# Patient Record
Sex: Male | Born: 1990 | Race: White | Hispanic: No | Marital: Single | Smoking: Former smoker
Health system: Southern US, Community
[De-identification: ages and names within clinical notes are randomized; demographics above are authoritative.]

## PROBLEM LIST (undated history)

## (undated) DIAGNOSIS — T7840XA Allergy, unspecified, initial encounter: Secondary | ICD-10-CM

## (undated) HISTORY — DX: Allergy, unspecified, initial encounter: T78.40XA

## (undated) HISTORY — PX: TONSILLECTOMY AND ADENOIDECTOMY: SUR1326

## (undated) HISTORY — PX: OTHER SURGICAL HISTORY: SHX169

---
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
## 2010-06-26 LAB — URINALYSIS, ROUTINE W REFLEX MICROSCOPIC
Hgb urine dipstick: NEGATIVE
Nitrite: NEGATIVE
Specific Gravity, Urine: 1.022 (ref 1.005–1.030)
Urobilinogen, UA: 0.2 mg/dL (ref 0.0–1.0)

## 2010-07-27 NOTE — Op Note (Signed)
[place]. [facility]  Patient:    Marvin Tyler, Marvin Tyler                     MRN: [id number] Proc. Date: 04/23/99 Adm. Date:  [id number] Attending:  Serena Colonel H CC:         Linward Headland, M.D.                           Operative Report  PREOPERATIVE DIAGNOSIS: 1. Eustachian tube dysfunction. 2. Chronic mucoid otitis media. 3. Chronic tonsillitis.  POSTOPERATIVE DIAGNOSIS: 1. Eustachian tube dysfunction. 2. Chronic mucoid otitis media. 3. Chronic tonsillitis.  OPERATION PERFORMED: 1. Bilateral myringotomy with tubes. 2. Tonsillectomy and adenoidectomy.  SURGEON:  Jefry H. Pollyann Kennedy, M.D.  ANESTHESIA:  General endotracheal.  COMPLICATIONS:  None.  ESTIMATED BLOOD LOSS:  10 cc.  FINDINGS:  Diffuse enlargement of the tonsils with mild enlargement of the adenoid pad and thin atrophic tympanic membranes without any signs of effusion today.  REFERRING PHYSICIAN:  Linward Headland, M.D.  HISTORY:  The patient is an 20-year-old with a history of recurrent tonsillitis ith chronic ear infections and history of tubes in the past.  The risks, benefits, alternatives and complications of the procedure were explained to the mother and father, who seemed to understand and agreed to surgery.  DESCRIPTION OF PROCEDURE:  The patient was taken to the operating room and placed on the operating table in supine position.  Following induction of general endotracheal anesthesia, the patient was prepped and draped in standard fashion.  1 - Bilateral myringotomy and tubes.  The ears were examined using the operating microscope and cleaned of cerumen.  Anterior inferior myringotomy incisions were created and Sheehy tubes were placed without difficulty bilaterally. Cortisporin was dripped into the ear canals and cotton balls placed at the external meatus bilaterally.  2 - Tonsillectomy and adenoidectomy.  The table was turned 90 degrees and the patient was  positioned for pharyngeal surgery.  A Crowe-Davis mouth gag was inserted into the oral cavity and used to retract the tongue and mandible and attached to the Mayo stand.  Inspection of the palate revealed no evidence of submucous cleft and the soft palate was of adequate length.  A red rubber catheter was inserted into the right side of the nose, withdrawn through the mouth and used to retract the soft palate and uvula.  Adenoid tissue was removed with a single  pass of a large adenoid curet.  The nasopharynx was then packed during the tonsillectomy.  Tonsillectomy was performed using electrocautery dissection keeping the capsule intact.  Small bleeders were coagulated along the dissection.  The packing was removed from the nasopharynx and suction cautery was used to obliterate additional lymphoid tissue and provide hemostasis of the adenoid bed.  After adequate hemostasis was achieved in the oropharynx and nasopharynx, the pharynx was suctioned of blood and secretions, irrigated with saline solution and an orogastric tube was used to aspirate the contents of the stomach.  The patient was then awakened, extubated and transported to recovery in stable condition. DD:  04/23/99 TD:  04/23/99 Job: [id number]

## 2014-11-21 ENCOUNTER — Emergency Department (HOSPITAL_COMMUNITY)
Admission: EM | Admit: 2014-11-21 | Discharge: 2014-11-21 | Disposition: A | Discharge status: Home / Self Care | Payer: 59 | Attending: Emergency Medicine | Admitting: Emergency Medicine

## 2014-11-21 ENCOUNTER — Encounter (HOSPITAL_COMMUNITY): Payer: Self-pay | Admitting: Emergency Medicine

## 2014-11-21 DIAGNOSIS — Z720 Tobacco use: Secondary | ICD-10-CM | POA: Diagnosis not present

## 2014-11-21 DIAGNOSIS — Y998 Other external cause status: Secondary | ICD-10-CM | POA: Diagnosis not present

## 2014-11-21 DIAGNOSIS — Y9289 Other specified places as the place of occurrence of the external cause: Secondary | ICD-10-CM | POA: Insufficient documentation

## 2014-11-21 DIAGNOSIS — Y9389 Activity, other specified: Secondary | ICD-10-CM | POA: Diagnosis not present

## 2014-11-21 DIAGNOSIS — S30860A Insect bite (nonvenomous) of lower back and pelvis, initial encounter: Secondary | ICD-10-CM | POA: Diagnosis not present

## 2014-11-21 DIAGNOSIS — W57XXXA Bitten or stung by nonvenomous insect and other nonvenomous arthropods, initial encounter: Secondary | ICD-10-CM | POA: Diagnosis not present

## 2014-11-21 NOTE — ED Provider Notes (Signed)
CSN: [id number]     Arrival date & time 11/21/14  1021 History   First MD Initiated Contact with Patient 11/21/14 1041     Chief Complaint  Patient presents with  . Insect Bite    HPI   24 year old male presents today with an insect bite. Patient reports yesterday he felt as if he had been bit by something on his lower back. He noticed immediate redness to the area. He reports the last night it was significantly red which larger than today. Patient reports that after waking up this morning he felt "tired". He reports the redness has reduced since the initial bite. Patient denies fever, chills, headache, joint aches.  History reviewed. No pertinent past medical history. Past Surgical History  Procedure Laterality Date  . Tonsillectomy and adenoidectomy    . Tubes in ears     Family History  Problem Relation Age of Onset  . Diabetes Other    Social History  Substance Use Topics  . Smoking status: Current Every Day Smoker    Types: Cigarettes  . Smokeless tobacco: None  . Alcohol Use: Yes     Comment: social    Review of Systems  All other systems reviewed and are negative.     Allergies  Review of patient's allergies indicates no known allergies.  Home Medications   Prior to Admission medications   Not on File   BP 119/62 mmHg  Pulse 93  Temp(Src) 97.9 F (36.6 C) (Oral)  Resp 16  SpO2 97% Physical Exam  Constitutional: He is oriented to person, place, and time. He appears well-developed and well-nourished.  HENT:  Head: Normocephalic and atraumatic.  Eyes: Conjunctivae are normal. Pupils are equal, round, and reactive to light. Right eye exhibits no discharge. Left eye exhibits no discharge. No scleral icterus.  Neck: Normal range of motion. No JVD present. No tracheal deviation present.  Cardiovascular: Regular rhythm, normal heart sounds and intact distal pulses.  Exam reveals no gallop and no friction rub.   No murmur heard. Pulmonary/Chest: Effort normal  and breath sounds normal. No stridor. No respiratory distress. He has no wheezes. He has no rales. He exhibits no tenderness.  Abdominal: Soft. He exhibits no distension and no mass. There is no tenderness. There is no rebound and no guarding.  Neurological: He is alert and oriented to person, place, and time. Coordination normal.  Skin: Skin is warm and dry.  Small area of redness to the lower back, no signs of cellulitis, warmth, discharge  Psychiatric: He has a normal mood and affect. His behavior is normal. Judgment and thought content normal.  Nursing note and vitals reviewed.   ED Course  Procedures (including critical care time) Labs Review Labs Reviewed - No data to display  Imaging Review No results found. I have personally reviewed and evaluated these images and lab results as part of my medical decision-making.   EKG Interpretation None      MDM   Final diagnoses:  Insect bite    Labs:  Imaging:  Consults:  Therapeutics:  Discharge Meds:   Assessment/Plan: Patient presents with likely localized reaction to insect bite, no signs of infection. Patient will be discharged home with instructions to use ice, Benadryl, follow up with primary care if symptoms persist, return immediately if symptoms worsen.         Eyvonne Mechanic, PA-C 11/22/14 2956  Raeford Razor, MD 12/01/14 [phone]

## 2014-11-21 NOTE — ED Notes (Signed)
Pt states he thinks he got bit by a spider on his back yesterday  Pt states he has generalized soreness all over his body  Pt states he does not know what kind of spider it was  Pt has red raised area noted to his lower back

## 2014-11-21 NOTE — Discharge Instructions (Signed)
Please read attached information please follow-up for further evaluation if symptoms persist beyond 3 days. Please return immediately if symptoms worsen.

## 2015-02-23 ENCOUNTER — Ambulatory Visit (INDEPENDENT_AMBULATORY_CARE_PROVIDER_SITE_OTHER): Payer: 59 | Admitting: Family Medicine

## 2015-02-23 VITALS — BP 120/70 | HR 68 | Temp 98.8°F | Resp 20 | Ht 69.0 in | Wt 147.6 lb

## 2015-02-23 DIAGNOSIS — A630 Anogenital (venereal) warts: Secondary | ICD-10-CM | POA: Diagnosis not present

## 2015-02-23 DIAGNOSIS — J302 Other seasonal allergic rhinitis: Secondary | ICD-10-CM | POA: Diagnosis not present

## 2015-02-23 DIAGNOSIS — Z0000 Encounter for general adult medical examination without abnormal findings: Secondary | ICD-10-CM | POA: Diagnosis not present

## 2015-02-23 DIAGNOSIS — Z711 Person with feared health complaint in whom no diagnosis is made: Secondary | ICD-10-CM

## 2015-02-23 DIAGNOSIS — M24211 Disorder of ligament, right shoulder: Secondary | ICD-10-CM

## 2015-02-23 NOTE — Progress Notes (Signed)
Patient ID: Marvin Tyler, male    DOB: 09/23/1990  Age: 24 y.o. MRN: [id number]  Shoulder pains  Subjective:   He had some kind of an injury in his right shoulder when he is very young. He is continued to have a popping in his shoulder when he rotates backwards. He has some mild decreased range of motion. He saw an orthopedist many years ago about it.  Current allergies, medications, problem list, past/family and social histories reviewed.  Objective:  BP 120/70 mmHg  Pulse 68  Temp(Src) 98.8 F (37.1 C) (Oral)  Resp 20  Ht 5\' 9"  (1.753 m)  Wt 147 lb 9.6 oz (66.951 kg)  BMI 21.79 kg/m2  SpO2 98%  There is a popping in the right shoulder girdle when he rotates it backwards. Strength seems good. Range of motion mildly decreased.  Assessment & Plan:   Assessment: Laxity of shoulder girdle   Plan: We'll refer to orthopedics for the shoulder laxity  Offered to treat his genital warts but he declined for now. He will return if he were to desires it done.  Orders Placed This Encounter  Procedures  . GC/Chlamydia Probe Amp  . RPR  . HIV antibody         Patient Instructions  Return if you desire me to freeze the warts on your penis. You can try over-the-counter methods if you desire.  Use protection when sexually involved  Return as needed  Referral to orthopedics    Return if symptoms worsen or fail to improve.   HOPPER,DAVID, MD 02/23/2015
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
## 2015-02-23 NOTE — Patient Instructions (Addendum)
Return if you desire me to freeze the warts on your penis. You can try over-the-counter methods if you desire.  Use protection when sexually involved  Return as needed  Referral to orthopedics

## 2015-02-24 LAB — SYPHILIS: RPR W/REFLEX TO RPR TITER AND TREPONEMAL ANTIBODIES, TRADITIONAL SCREENING AND DIAGNOSIS ALGORITHM

## 2015-02-25 LAB — GC/CHLAMYDIA PROBE AMP
CT Probe RNA: NOT DETECTED
GC PROBE AMP APTIMA: NOT DETECTED

## 2015-02-25 LAB — HIV ANTIBODY (ROUTINE TESTING W REFLEX): HIV 1&2 Ab, 4th Generation: NONREACTIVE

## 2015-04-18 ENCOUNTER — Ambulatory Visit: Payer: Worker's Compensation

## 2015-04-18 ENCOUNTER — Ambulatory Visit (INDEPENDENT_AMBULATORY_CARE_PROVIDER_SITE_OTHER): Payer: Worker's Compensation | Admitting: Internal Medicine

## 2015-04-18 VITALS — BP 112/72 | HR 75 | Temp 98.4°F | Resp 18 | Ht 69.0 in | Wt 150.0 lb

## 2015-04-18 DIAGNOSIS — S39012A Strain of muscle, fascia and tendon of lower back, initial encounter: Secondary | ICD-10-CM | POA: Diagnosis not present

## 2015-04-18 DIAGNOSIS — S39011A Strain of muscle, fascia and tendon of abdomen, initial encounter: Secondary | ICD-10-CM

## 2015-04-18 DIAGNOSIS — M546 Pain in thoracic spine: Secondary | ICD-10-CM

## 2015-04-18 DIAGNOSIS — S233XXA Sprain of ligaments of thoracic spine, initial encounter: Secondary | ICD-10-CM | POA: Diagnosis not present

## 2015-04-18 DIAGNOSIS — S29012A Strain of muscle and tendon of back wall of thorax, initial encounter: Secondary | ICD-10-CM

## 2015-04-18 MED ORDER — MELOXICAM 15 MG PO TABS: 15.0000 mg | ORAL_TABLET | Freq: Every day | ORAL | Status: DC

## 2015-04-18 MED ORDER — CYCLOBENZAPRINE HCL 10 MG PO TABS: 10.0000 mg | ORAL_TABLET | Freq: Three times a day (TID) | ORAL | Status: DC | PRN

## 2015-04-18 MED ORDER — HYDROCODONE-ACETAMINOPHEN 5-325 MG PO TABS: 1.0000 | ORAL_TABLET | Freq: Four times a day (QID) | ORAL | Status: DC | PRN

## 2015-04-18 NOTE — Progress Notes (Addendum)
Subjective:  By signing my name below, I, Essence Howell, attest that this documentation has been prepared under the direction and in the presence of Tonye Pearson, MD Electronically Signed: Charline Bills, ED Scribe 04/18/2015 at 2:24 PM.   Patient ID: Marvin Tyler, male    DOB: Dec 17, 1990, 25 y.o.   MRN: [id number]  Chief Complaint  Patient presents with  . Back Injury    he twisted his back today, while trying to lift something heavy   HPI HPI Comments: Marvin Tyler is a 25 y.o. male who presents to the [facility] complaining of worker's comp back injury sustained PTA. Pt states that he was kneeling on 1 knee and pulling a heavy filter out of a manhole when he felt sudden onset of stinging sensation between his shoulder blades, his low back and his lower abdomen-- pain in thoracic area now described as a constant stabbing pain. Pain is worsened with breathing and twisting. Getting worse as day goes on No prior back injuries  Past Medical History  Diagnosis Date  . Allergy    No current outpatient prescriptions on file prior to visit.   No current facility-administered medications on file prior to visit.   No Known Allergies   Review of Systems  Musculoskeletal: Positive for back pain.      Objective:   Physical Exam  Constitutional: He is oriented to person, place, and time. He appears well-developed and well-nourished. No distress.  HENT:  Head: Normocephalic and atraumatic.  Eyes: Conjunctivae and EOM are normal.  Neck: Normal range of motion. Neck supple.  The neck has a full ROM.   Cardiovascular: Normal rate.   Pulmonary/Chest: Effort normal. No respiratory distress.  Abdominal:  Soft. Not distended. LLQ has 2cm area of tenderness and sl swelling but no abd wall muscle defect  Musculoskeletal: Normal range of motion.  The spine is straight without obvious swelling. Very tender to palpation T3-T9 with pain on rotation. The lumbar spine  is mildly tender over the L iliac crest but straight leg raise to 90 degrees bilaterally. Intact. No sensory or motor losses.   Neurological: He is alert and oriented to person, place, and time.  Skin: Skin is warm and dry.  Psychiatric: He has a normal mood and affect. His behavior is normal.  Nursing note and vitals reviewed.    xray=no bony abn of Tspine Assessment & Plan:  Pain in thoracic spine - Plan: DG Thoracic Spine 2 View  Thoracic sprain and strain, initial encounter  Abdominal muscle strain, initial encounter  Lumbar strain, initial encounter  Meds ordered this encounter  Medications  . meloxicam (MOBIC) 15 MG tablet    Sig: Take 1 tablet (15 mg total) by mouth daily.    Dispense:  30 tablet    Refill:  0  . cyclobenzaprine (FLEXERIL) 10 MG tablet    Sig: Take 1 tablet (10 mg total) by mouth 3 (three) times daily as needed for muscle spasms. If makes too sleepy just take at bedtime    Dispense:  40 tablet    Refill:  0  . HYDROcodone-acetaminophen (NORCO) 5-325 MG tablet    Sig: Take 1 tablet by mouth every 6 (six) hours as needed for moderate pain. May use 2 at bedtime if can't sleep. Advise not taking during day ay work.    Dispense:  20 tablet    Refill:  0   fu 1 wk/work restrictions  I have completed the  patient encounter in its entirety as documented by the scribe, with editing by me where necessary. Saw [place] P. Laney Pastor, M.D.

## 2015-04-24 ENCOUNTER — Ambulatory Visit (INDEPENDENT_AMBULATORY_CARE_PROVIDER_SITE_OTHER): Payer: Worker's Compensation | Admitting: Physician Assistant

## 2015-04-24 ENCOUNTER — Encounter: Payer: Self-pay | Admitting: *Deleted

## 2015-04-24 VITALS — BP 108/66 | HR 97 | Temp 98.4°F | Resp 18 | Ht 69.0 in | Wt 148.6 lb

## 2015-04-24 DIAGNOSIS — S39012D Strain of muscle, fascia and tendon of lower back, subsequent encounter: Secondary | ICD-10-CM | POA: Diagnosis not present

## 2015-04-24 DIAGNOSIS — Z791 Long term (current) use of non-steroidal anti-inflammatories (NSAID): Secondary | ICD-10-CM | POA: Diagnosis not present

## 2015-04-24 DIAGNOSIS — Z79899 Other long term (current) drug therapy: Secondary | ICD-10-CM | POA: Insufficient documentation

## 2015-04-24 DIAGNOSIS — S39011D Strain of muscle, fascia and tendon of abdomen, subsequent encounter: Secondary | ICD-10-CM

## 2015-04-24 DIAGNOSIS — Y998 Other external cause status: Secondary | ICD-10-CM | POA: Diagnosis not present

## 2015-04-24 DIAGNOSIS — Y9389 Activity, other specified: Secondary | ICD-10-CM | POA: Insufficient documentation

## 2015-04-24 DIAGNOSIS — S29012D Strain of muscle and tendon of back wall of thorax, subsequent encounter: Secondary | ICD-10-CM | POA: Diagnosis not present

## 2015-04-24 DIAGNOSIS — S233XXD Sprain of ligaments of thoracic spine, subsequent encounter: Secondary | ICD-10-CM

## 2015-04-24 DIAGNOSIS — F17210 Nicotine dependence, cigarettes, uncomplicated: Secondary | ICD-10-CM | POA: Insufficient documentation

## 2015-04-24 DIAGNOSIS — W2209XA Striking against other stationary object, initial encounter: Secondary | ICD-10-CM | POA: Insufficient documentation

## 2015-04-24 DIAGNOSIS — T1592XA Foreign body on external eye, part unspecified, left eye, initial encounter: Secondary | ICD-10-CM | POA: Diagnosis present

## 2015-04-24 DIAGNOSIS — Y9289 Other specified places as the place of occurrence of the external cause: Secondary | ICD-10-CM | POA: Insufficient documentation

## 2015-04-24 NOTE — Patient Instructions (Signed)
Take the meloxicam (anti-inflammatory) every day.  Take the cyclobenzaprine at bedtime, and you can try taking 1/2 tablet to see if that is less disruptive in the morning.

## 2015-04-24 NOTE — Progress Notes (Signed)
Marvin Tyler 1991/01/13 24 y.o.   Chief Complaint  Patient presents with  . Follow-up    W/C     Date of Injury: 04/18/2015  History of Present Illness:  Presents for re-evaluation of work-related complaint.  He sustained strain of the thoracic and lumbar spine and abdominal wall on 04/18/2015. Xrays of the T-spine were negative for bony injury.  Prescribed meloxicam, cyclobenzaprine and hydrocodone, taken out of work, and advised to RTC in 1 week.  Still sore.  "I haven't really done anything besides take care of my little girl and lay around the house." Lifting her is "terrible." She is 4 months old and weighs about 20 lbs. Feels the pain between his shoulders and in the lower back. Lifting and twisting cause burning sensation. Even sitting in the exam room, he describes a stinging sensation. Driving is better.   Didn't like how the hydrocodone and cyclobenzaprine made him feel, so he stopped them. Only took the meloxicam about 3 times "I'm bad with remembering to take medicine."  Not ready to do regular duty. 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     Review of Systems  Constitutional: Negative for fever, chills and malaise/fatigue.  Cardiovascular: Negative for leg swelling.  Gastrointestinal: Negative for abdominal pain.  Genitourinary: Negative for dysuria, urgency, frequency and flank pain.  Musculoskeletal: Positive for myalgias and back pain. Negative for joint pain, falls and neck pain.  Neurological: Negative for dizziness, tingling, sensory change, focal weakness and weakness.  Endo/Heme/Allergies: Does not bruise/bleed easily.      No Known Allergies   Current medications reviewed and updated. Past medical history, family history, social history have been reviewed and updated.   Physical Exam  Constitutional: He is oriented to person, place, and time and well-developed, well-nourished, and in no  distress. No distress.  BP 108/66 mmHg  Pulse 97  Temp(Src) 98.4 F (36.9 C) (Oral)  Resp 18  Ht  (1.753 m)  Wt 148 lb 9.6 oz (67.405 kg)  BMI 21.93 kg/m2  SpO2 97%   HENT:  Head: Normocephalic and atraumatic.  Eyes: Conjunctivae are normal. No scleral icterus.  Neck: Neck supple. No thyromegaly present.  Cardiovascular: Normal rate.   Pulmonary/Chest: Effort normal.  Musculoskeletal:       Cervical back: Normal.       Thoracic back: He exhibits tenderness (mild), bony tenderness (mild) and pain. He exhibits normal range of motion, no swelling, no edema, no deformity, no laceration, no spasm and normal pulse.       Lumbar back: Normal.       Back:  Lymphadenopathy:    He has no cervical adenopathy.  Neurological: He is alert and oriented to person, place, and time. He has normal motor skills, normal sensation, normal strength and normal reflexes. He displays normal speech and normal reflexes. Gait normal.     Assessment and Plan:  1. Thoracic sprain and strain, subsequent encounter 2. Lumbar strain, subsequent encounter Improving. Advised to take the meloxicam daily, and try taking 1/2 tablet of cyclobenzaprine at HS to see if he gets some benefit without feeling groggy in the morning. RTW with modified duty: no lifting, bending/stooping, twisting, pulling/pushing >5 lbs. Re-evaluate in 1 week.   3. Abdominal muscle strain, subsequent encounter Resolved.   Fernande Bras, PA-C Physician Assistant-Certified [facility]
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
## 2015-04-24 NOTE — ED Notes (Signed)
Vision check 20/20 both eyes.

## 2015-04-25 ENCOUNTER — Emergency Department
Admission: EM | Admit: 2015-04-25 | Discharge: 2015-04-25 | Disposition: A | Discharge status: Home / Self Care | Payer: 59 | Attending: Emergency Medicine | Admitting: Emergency Medicine

## 2015-04-25 DIAGNOSIS — T1592XA Foreign body on external eye, part unspecified, left eye, initial encounter: Secondary | ICD-10-CM

## 2015-04-25 MED ORDER — CIPROFLOXACIN HCL 0.3 % OP SOLN
2.0000 [drp] | Freq: Once | OPHTHALMIC | Status: AC
  Administered 2015-04-25: 2 [drp] via OPHTHALMIC
  Filled 2015-04-25: qty 2.5

## 2015-04-25 MED ORDER — FLUORESCEIN SODIUM 1 MG OP STRP
ORAL_STRIP | OPHTHALMIC | Status: AC
  Filled 2015-04-25: qty 1

## 2015-04-25 MED ORDER — FLUORESCEIN SODIUM 1 MG OP STRP: 1.0000 | ORAL_STRIP | Freq: Once | OPHTHALMIC | Status: DC

## 2015-04-25 MED ORDER — TETRACAINE HCL 0.5 % OP SOLN
OPHTHALMIC | Status: AC
  Administered 2015-04-25: 1 [drp] via OPHTHALMIC
  Filled 2015-04-25: qty 2

## 2015-04-25 MED ORDER — TETRACAINE HCL 0.5 % OP SOLN
1.0000 [drp] | Freq: Once | OPHTHALMIC | Status: AC
  Administered 2015-04-25: 1 [drp] via OPHTHALMIC

## 2015-04-25 NOTE — Discharge Instructions (Signed)
Eye Foreign Body  A foreign body refers to any object on the surface of the eye or in the eyeball that should not be there. A foreign body may be a small speck of dirt or dust, a hair or eyelash, a splinter, or any other object.   SIGNS AND SYMPTOMS  Symptoms depend on what the foreign body is and where it is in the eye. The most common locations are:    On the inner surface of the upper or lower eyelids or on the covering of the white part of the eye (conjunctiva). Symptoms in this location are:    Pain and irritation, especially when blinking.    The feeling that something is in the eye.   On the surface of the clear covering on the front of the eye (cornea). Symptoms in this location include:    Pain and irritation.     Small "rust rings" around a metallic foreign body.    The feeling that something is in the eye.    Inside the eyeball. Foreign bodies inside the eye may cause:     Great pain.     Immediate loss of vision.     Distortion of the pupil.  DIAGNOSIS   Foreign bodies are found during an exam by an eye specialist. Those on the eyelids, conjunctiva, or cornea are usually (but not always) easily found. When a foreign body is inside the eyeball, a cloudiness of the lens (cataract) may form almost right away. This makes it hard for an eye specialist to find the foreign body. Tests may be needed, including ultrasound testing, X-rays, and CT scans.  TREATMENT    Foreign bodies on the eyelids, conjunctiva, or cornea are often removed easily and painlessly.   Rust in the cornea may require the use of a drill-like instrument to remove the rust.   If the foreign body has caused a scratch or a rubbing or scraping (abrasion) of the cornea, this may be treated with antibiotic drops or ointment. A pressure patch may be put over your eye.   If the foreign body is inside your eyeball, surgery is needed right away. This is a medical emergency. Foreign bodies inside the eye threaten vision. A person may even  lose his or her eye.  HOME CARE INSTRUCTIONS    Take medicines only as directed by your health care provider. Use eye drops or ointment as directed.   If no eye patch was applied:    Keep your eye closed as much as possible.    Do not rub your eye.    Wear dark glasses as needed to protect your eyes from bright light.    Do not wear contact lenses until your eye feels normal again, or as instructed by your health care provider.    Wear a protective eye covering if there is a risk of eye injury. This is important when working with high-speed tools.   If your eye is patched:    Follow your health care provider's instructions for when to remove the patch.    Do notdrive or operate machinery if your eye is patched. Your ability to judge distances is impaired.   Keep all follow-up visits as directed by your health care provider. This is important.  SEEK MEDICAL CARE IF:    You have increased pain in your eye.   Your vision gets worse.    You have problems with your eye patch.    You have fluid (discharge)   coming from your injured eye.    You have redness and swelling around your affected eye.   MAKE SURE YOU:    Understand these instructions.   Will watch your condition.   Will get help right away if you are not doing well or get worse.     This information is not intended to replace advice given to you by your health care provider. Make sure you discuss any questions you have with your health care provider.     Document Released: 02/25/2005 Document Revised: 03/18/2014 Document Reviewed: 07/23/2012  [employer]vier Interactive Patient Education 2016 [employer]

## 2015-04-25 NOTE — ED Provider Notes (Signed)
[facility] Emergency Department Provider Note  ____________________________________________  Time seen: 12:45 AM  I have reviewed the triage vital signs and the nursing notes.   HISTORY  Chief Complaint Foreign Body in Eye      HPI Marvin Tyler is a 25 y.o. male presents with history of grinding metal yesterday with piece of metal in his left eye. Patient states that he noticed it to the right of his pupil.     Past Medical History  Diagnosis Date  . Allergy     There are no active problems to display for this patient.   Past Surgical History  Procedure Laterality Date  . Tonsillectomy and adenoidectomy    . Tubes in ears      Current Outpatient Rx  Name  Route  Sig  Dispense  Refill  . cyclobenzaprine (FLEXERIL) 10 MG tablet   Oral   Take 1 tablet (10 mg total) by mouth 3 (three) times daily as needed for muscle spasms. If makes too sleepy just take at bedtime   40 tablet   0   . HYDROcodone-acetaminophen (NORCO) 5-325 MG tablet   Oral   Take 1 tablet by mouth every 6 (six) hours as needed for moderate pain. May use 2 at bedtime if can't sleep. Advise not taking during day ay work.   20 tablet   0   . meloxicam (MOBIC) 15 MG tablet   Oral   Take 1 tablet (15 mg total) by mouth daily.   30 tablet   0   . triamcinolone (NASACORT) 55 MCG/ACT AERO nasal inhaler   Nasal   Place 2 sprays into the nose daily.           Allergies No known drug allergies  Family History  Problem Relation Age of Onset  . Diabetes Other   . Headache Brother     Social History Social History  Substance Use Topics  . Smoking status: Current Every Day Smoker -- 0.50 packs/day    Types: Cigarettes  . Smokeless tobacco: Never Used  . Alcohol Use: 0.6 - 1.2 oz/week    1-2 Standard drinks or equivalent per week     Comment: social    Review of Systems  Constitutional: Negative for fever. Eyes: Negative for visual changes. Positive for  left eye foreign body ENT: Negative for sore throat. Cardiovascular: Negative for chest pain. Respiratory: Negative for shortness of breath. Gastrointestinal: Negative for abdominal pain, vomiting and diarrhea. Genitourinary: Negative for dysuria. Musculoskeletal: Negative for back pain. Skin: Negative for rash. Neurological: Negative for headaches, focal weakness or numbness.   10-point ROS otherwise negative.  ____________________________________________   PHYSICAL EXAM:  VITAL SIGNS: ED Triage Vitals  Enc Vitals Group     BP 04/24/15 2156 110/68 mmHg     Pulse Rate 04/24/15 2156 79     Resp 04/24/15 2156 18     Temp 04/24/15 2156 98.4 F (36.9 C)     Temp Source 04/24/15 2156 Oral     SpO2 04/24/15 2156 98 %     Weight 04/24/15 2156 146 lb (66.225 kg)     Height 04/24/15 2156  (1.753 m)     Head Cir --      Peak Flow --      Pain Score 04/24/15 2158 4     Pain Loc --      Pain Edu? --      Excl. in GC? --  Constitutional: Alert and oriented. Well appearing and in no distress. Eyes: Conjunctivae are normal. PERRL. Normal extraocular movements. Foreign body noted at the 7:00 position of the left eye ENT   Head: Normocephalic and atraumatic.   Nose: No congestion/rhinnorhea.   Mouth/Throat: Mucous membranes are moist.   Neck: No stridor. Musculoskeletal: Nontender with normal range of motion in all extremities. No joint effusions.  No lower extremity tenderness nor edema. Neurologic:  Normal speech and language. No gross focal neurologic deficits are appreciated. Speech is normal.  Skin:  Skin is warm, dry and intact. No rash noted.     INITIAL IMPRESSION / ASSESSMENT AND PLAN / ED COURSE  Pertinent labs & imaging results that were available during my care of the patient were reviewed by me and considered in my medical decision making (see chart for details).  Left eye was anesthetized with tetracaine foreign body removed with small gauge  insulin needle. Without difficulty.  ____________________________________________   FINAL CLINICAL IMPRESSION(S) / ED DIAGNOSES  Final diagnoses:  Eye foreign body, left, initial encounter      Darci Current, MD 04/25/15 [phone]

## 2015-05-01 ENCOUNTER — Ambulatory Visit (INDEPENDENT_AMBULATORY_CARE_PROVIDER_SITE_OTHER): Payer: Worker's Compensation | Admitting: Physician Assistant

## 2015-05-01 VITALS — BP 108/62 | HR 71 | Temp 98.7°F | Resp 16

## 2015-05-01 DIAGNOSIS — S39012D Strain of muscle, fascia and tendon of lower back, subsequent encounter: Secondary | ICD-10-CM

## 2015-05-01 DIAGNOSIS — S233XXD Sprain of ligaments of thoracic spine, subsequent encounter: Secondary | ICD-10-CM

## 2015-05-01 DIAGNOSIS — S29012D Strain of muscle and tendon of back wall of thorax, subsequent encounter: Secondary | ICD-10-CM | POA: Diagnosis not present

## 2015-05-01 NOTE — Progress Notes (Signed)
Marvin Tyler 1990-04-05 25 y.o.   Chief Complaint  Patient presents with  . Follow-up    WC thoracic and lumbar strain  . Foreign Body in Eye    possible metal     Date of Injury: 04/18/2015  History of Present Illness:  Presents for re-evaluation of work-related complaint.  He sustained strain of the thoracic and lumbar spine and abdominal wall on 04/18/2015. Xrays of the T-spine were negative for bony injury.  Prescribed meloxicam, cyclobenzaprine and hydrocodone, taken out of work, and advised to RTC in 1 week.  On 04/24/15, he reported he was still sore. Wasn't able to lift even 20 lbs. The pain was between the shoulders and in the lower back. Lifting and twisting caused burning sensation. Even sitting in the exam room, he described a stinging sensation.  Didn't like how the hydrocodone and cyclobenzaprine made him feel, so he stopped them. Only took the meloxicam about 3 times "I'm bad with remembering to take medicine." The abdominal muscle pain had resolved completely. He was encouraged to take the meloxicam daily, and to try taking just 1/2 tablet of cyclobenzaprine at HS to see if that helped and didn't cause the adverse effects.  Today he's feeling a lot better. He hadn't really thought the meloxicam was helping much, but on days when he missed the dose, he could definitely tell.  The lower back pain has resolved. He still feels a stabbing pain along the LEFT shoulder blade. No radicular symptoms. No weakness.  On 04/25/2015 he presented to [facility] ED with FB in the LEFT eye. A small piece of metal was removed without incident. He is tolerating the drops without difficulty, and vision is not impaired. No pain or drainage from the eye.    Review of Systems  Constitutional: Negative for fever, chills and malaise/fatigue.  Eyes: Negative for blurred vision, pain, discharge and redness.  Cardiovascular: Negative for leg swelling.  Gastrointestinal: Negative for abdominal pain.    Genitourinary: Negative for dysuria, urgency, frequency and flank pain.  Musculoskeletal: Positive for myalgias and back pain. Negative for joint pain, falls and neck pain.  Neurological: Negative for dizziness, tingling, sensory change, focal weakness and weakness.  Endo/Heme/Allergies: Does not bruise/bleed easily.      No Known Allergies   Current medications reviewed and updated. Past medical history, family history, social history have been reviewed and updated.   Physical Exam  Constitutional: He is oriented to person, place, and time and well-developed, well-nourished, and in no distress. No distress.  BP 108/66 mmHg  Pulse 97  Temp(Src) 98.4 F (36.9 C) (Oral)  Resp 18  Ht  (1.753 m)  Wt 148 lb 9.6 oz (67.405 kg)  BMI 21.93 kg/m2  SpO2 97%   HENT:  Head: Normocephalic and atraumatic.  Eyes: Conjunctivae, EOM and lids are normal. Pupils are equal, round, and reactive to light. Right eye exhibits no discharge. Left eye exhibits no discharge. No scleral icterus.  No rust ring or FB appreciated on exam with oblique lighting.  Neck: Full passive range of motion without pain and phonation normal. Neck supple. No thyromegaly present.  Cardiovascular: Normal rate.   Pulmonary/Chest: Effort normal.  Musculoskeletal:       Cervical back: Normal.       Thoracic back: He exhibits tenderness (mild) and pain. He exhibits normal range of motion, no bony tenderness (mild), no swelling, no edema, no deformity, no laceration, no spasm and normal pulse.       Lumbar  back: Normal.       Back:  Lymphadenopathy:    He has no cervical adenopathy.  Neurological: He is alert and oriented to person, place, and time. He has normal motor skills, normal sensation, normal strength and normal reflexes. He displays normal speech. Gait normal.  Skin: Skin is warm, dry and intact. No rash noted.     Assessment and Plan:  1. Thoracic sprain and strain, subsequent encounter Improving.  Encouraged continued use of meloxicam. Continue modified duty, but allow lifting up to 20 lbs. Anticipate return to full duty in 1 week.  2. Lumbar strain, subsequent encounter Resolved.    Fernande Bras, PA-C Physician Assistant-Certified [facility]

## 2015-05-01 NOTE — Patient Instructions (Signed)
Please take the meloxicam (anti-inflammatory EVERY DAY). Use the muscle relaxer (cyclobenzaprine), if you need it. Try taking 1/2 tablet at bedtime.

## 2015-05-08 ENCOUNTER — Ambulatory Visit (INDEPENDENT_AMBULATORY_CARE_PROVIDER_SITE_OTHER): Payer: Worker's Compensation | Admitting: Family Medicine

## 2015-05-08 VITALS — BP 120/68 | HR 77 | Temp 98.6°F | Resp 16 | Ht 69.0 in | Wt 147.0 lb

## 2015-05-08 DIAGNOSIS — S29012D Strain of muscle and tendon of back wall of thorax, subsequent encounter: Secondary | ICD-10-CM

## 2015-05-08 DIAGNOSIS — S233XXD Sprain of ligaments of thoracic spine, subsequent encounter: Secondary | ICD-10-CM

## 2015-05-08 NOTE — Patient Instructions (Signed)
Back Pain, Adult °Back pain is very common in adults. The cause of back pain is rarely dangerous and the pain often gets better over time. The cause of your back pain may not be known. Some common causes of back pain include: °· Strain of the muscles or ligaments supporting the spine. °· Wear and tear (degeneration) of the spinal disks. °· Arthritis. °· Direct injury to the back. °For many people, back pain may return. Since back pain is rarely dangerous, most people can learn to manage this condition on their own. °HOME CARE INSTRUCTIONS °Watch your back pain for any changes. The following actions may help to lessen any discomfort you are feeling: °· Remain active. It is stressful on your back to sit or stand in one place for long periods of time. Do not sit, drive, or stand in one place for more than 30 minutes at a time. Take short walks on even surfaces as soon as you are able. Try to increase the length of time you walk each day. °· Exercise regularly as directed by your health care provider. Exercise helps your back heal faster. It also helps avoid future injury by keeping your muscles strong and flexible. °· Do not stay in bed. Resting more than 1-2 days can delay your recovery. °· Pay attention to your body when you bend and lift. The most comfortable positions are those that put less stress on your recovering back. Always use proper lifting techniques, including: °· Bending your knees. °· Keeping the load close to your body. °· Avoiding twisting. °· Find a comfortable position to sleep. Use a firm mattress and lie on your side with your knees slightly bent. If you lie on your back, put a pillow under your knees. °· Avoid feeling anxious or stressed. Stress increases muscle tension and can worsen back pain. It is important to recognize when you are anxious or stressed and learn ways to manage it, such as with exercise. °· Take medicines only as directed by your health care provider. Over-the-counter  medicines to reduce pain and inflammation are often the most helpful. Your health care provider may prescribe muscle relaxant drugs. These medicines help dull your pain so you can more quickly return to your normal activities and healthy exercise. °· Apply ice to the injured area: °· Put ice in a plastic bag. °· Place a towel between your skin and the bag. °· Leave the ice on for 20 minutes, 2-3 times a day for the first 2-3 days. After that, ice and heat may be alternated to reduce pain and spasms. °· Maintain a healthy weight. Excess weight puts extra stress on your back and makes it difficult to maintain good posture. °SEEK MEDICAL CARE IF: °· You have pain that is not relieved with rest or medicine. °· You have increasing pain going down into the legs or buttocks. °· You have pain that does not improve in one week. °· You have night pain. °· You lose weight. °· You have a fever or chills. °SEEK IMMEDIATE MEDICAL CARE IF:  °· You develop new bowel or bladder control problems. °· You have unusual weakness or numbness in your arms or legs. °· You develop nausea or vomiting. °· You develop abdominal pain. °· You feel faint. °  °This information is not intended to replace advice given to you by your health care provider. Make sure you discuss any questions you have with your health care provider. °  °Document Released: 02/25/2005 Document Revised: 03/18/2014 Document Reviewed: 06/29/2013 °[employer] Interactive Patient Education ©2016 [employer] Scapular Winging With Rehab Scapular winging syndrome is also known as serratus anterior palsy or long thoracic nerve injury. The condition is an uncommon injury to the nervous system. The condition is caused by injury to the long thoracic nerve that runs through the neck and shoulder. Injury to the shoulder, such as a fall or repetitive stress on the shoulder causes the nerve to become stretched. Occasionally the injury is the result of an infection of the nerve. Damage  to the long thoracic nerve results in weakness of the serratus anterior muscle. The serratus anterior muscle is responsible for controlling the shoulder blade (scapula). Weakness in this muscle results in a instability (winging) of the scapula. SYMPTOMS   Pain and weakness in the shoulder (usually the back of the shoulder) that is often diffuse or unable to localize.  Loss of or decrease in shoulder function.  Upper back pain while sitting, due to the scapula pressing on the back of the chair.  Visible deformity in the back of the shoulder. CAUSES  Scapular winging is caused by stretching of the long thoracic nerve. Common mechanisms of injury include:  Viral illness.  Repetitive and/or stressful use of the shoulder.  Falling onto the shoulder with the head and neck stretched away from the shoulder. RISK INCREASES WITH:  Contact sports (football, rugby, lacrosse, or soccer).  Activities involving overhead arm movement (baseball, volleyball, or racquet sports).  Poor strength and flexibility. PREVENTION  Warm up and stretch properly before activity.  Allow for adequate recovery between workouts.  Maintain physical fitness:  Strength, flexibility, and endurance.  Cardiovascular fitness.  Learn and use proper technique. When possible, have a coach correct improper technique. PROGNOSIS  Scapular winging normally resolves spontaneously within 18 months. In rare circumstances surgery is recommended.  RELATED COMPLICATIONS   Permanent nerve damage, including pain, numbness, tingle, or weakness.  Shoulder weakness.  Recurrent shoulder pain.  Inability to compete in athletics. TREATMENT Treatment initially involves resting from any activities that aggravate your symptoms. The use of ice and medication may help reduce pain and inflammation. The use of strengthening and stretching exercises may help reduce pain with activity, specifically shoulder exercises that improve range  of motion. These exercises may be performed at home or with referral to a therapist. If symptoms persist for greater than 6 months despite non-surgical (conservative) treatment, then surgery may be recommended. Surgery is only used for the most serious cases and the purpose is to regain function, not to allow an athlete to return to sports. MEDICATION   If pain medication is necessary, then nonsteroidal anti-inflammatory medications, such as aspirin and ibuprofen, or other minor pain relievers, such as acetaminophen, are often recommended.  Do not take pain medication for 7 days before surgery.  Prescription pain relievers may be given if deemed necessary by your caregiver. Use only as directed and only as much as you need. HEAT AND COLD  Cold treatment (icing) relieves pain and reduces inflammation. Cold treatment should be applied for 10 to 15 minutes every 2 to 3 hours for inflammation and pain and immediately after any activity that aggravates your symptoms. Use ice packs or massage the area with a piece of ice (ice massage).  Heat treatment may be used prior to performing the stretching and strengthening activities prescribed by your caregiver, physical therapist, or athletic trainer. Use a heat pack or soak the injury in warm water. SEEK MEDICAL CARE IF:  Treatment seems to offer no benefit, or the condition worsens.  Any medications produce adverse side effects. EXERCISES  RANGE OF MOTION (ROM) AND STRETCHING EXERCISES - Scapular Winging (Serratus Anterior Palsy, Long Thoracic Nerve Injury)  These exercises may help you when beginning to rehabilitate your injury. Your symptoms may resolve with or without further involvement from your physician, physical therapist or athletic trainer. While completing these exercises, remember:   Restoring tissue flexibility helps normal motion to return to the joints. This allows healthier, less painful movement and activity.  An effective stretch  should be held for at least 30 seconds.  A stretch should never be painful. You should only feel a gentle lengthening or release in the stretched tissue. ROM - Pendulum  Bend at the waist so that your right / left arm falls away from your body. Support yourself with your opposite hand on a solid surface, such as a table or a countertop.  Your right / left arm should be perpendicular to the ground. If it is not perpendicular, you need to lean over farther. Relax the muscles in your right / left arm and shoulder as much as possible.  Gently sway your hips and trunk so they move your right / left arm without any use of your right / left shoulder muscles.  Progress your movements so that your right / left arm moves side to side, then forward and backward, and finally, both clockwise and counterclockwise.  Complete __________ repetitions in each direction. Many people use this exercise to relieve discomfort in their shoulder as well as to gain range of motion. Repeat __________ times. Complete this exercise __________ times per day. STRETCH - Flexion, Seated   Sit in a firm chair so that your right / left forearm can rest on a table or on a table or countertop. Your right / left elbow should rest below the height of your shoulder so that your shoulder feels supported and not tense or uncomfortable.  Keeping your right / left shoulder relaxed, lean forward at your waist, allowing your right / left hand to slide forward. Bend forward until you feel a moderate stretch in your shoulder, but before you feel an increase in your pain.  Hold __________ seconds. Slowly return to your starting position. Repeat __________ times. Complete this exercise __________ times per day.  STRETCH - Flexion, Standing  Stand with good posture. With an underhand grip on your right / left and an overhand grip on the opposite hand, grasp a broomstick or cane so that your hands are a little more than shoulder-width  apart.  Keeping your right / left elbow straight and shoulder muscles relaxed, push the stick with your opposite hand to raise your right / left arm in front of your body and then overhead. Raise your arm until you feel a stretch in your right / left shoulder, but before you have increased shoulder pain.  Avoid shrugging your right / left shoulder as your arm rises by keeping your shoulder blade tucked down and toward your mid-back spine. Hold __________ seconds.  Slowly return to the starting position. Repeat __________ times. Complete this exercise __________ times per day. STRETCH - Abduction, Supine  Stand with good posture. With an underhand grip on your right / left and an overhand grip on the opposite hand, grasp a broomstick or cane so that your hands are a little more than shoulder-width apart.  Keeping your right / left elbow straight and shoulder muscles relaxed, push the stick with your opposite hand to raise your right / left arm  out to the side of your body and then overhead. Raise your arm until you feel a stretch in your right / left shoulder, but before you have increased shoulder pain.  Avoid shrugging your right / left shoulder as your arm rises by keeping your shoulder blade tucked down and toward your mid-back spine. Hold __________ seconds.  Slowly return to the starting position. Repeat __________ times. Complete this exercise __________ times per day. ROM - Flexion, Active-Assisted  Lie on your back. You may bend your knees for comfort.  Grasp a broomstick or cane so your hands are about shoulder-width apart. Your right / left hand should grip the end of the stick/cane so that your hand is positioned "thumbs-up," as if you were about to shake hands.  Using your healthy arm to lead, raise your right / left arm overhead until you feel a gentle stretch in your shoulder. Hold __________ seconds.  Use the stick/cane to assist in returning your right / left arm to its  starting position. Repeat __________ times. Complete this exercise __________ times per day.  STRENGTHENING EXERCISES - Scapular Winging (Serratus Anterior Palsy, Long Thoracic Nerve Injury) These exercises may help you when beginning to rehabilitate your injury. They may resolve your symptoms with or without further involvement from your physician, physical therapist or athletic trainer. While completing these exercises, remember:   Muscles can gain both the endurance and the strength needed for everyday activities through controlled exercises.  Complete these exercises as instructed by your physician, physical therapist or athletic trainer. Progress with the resistance and repetition exercises only as your caregiver advises.  You may experience muscle soreness or fatigue, but the pain or discomfort you are trying to eliminate should never worsen during these exercises. If this pain does worsen, stop and make certain you are following the directions exactly. If the pain is still present after adjustments, discontinue the exercise until you can discuss the trouble with your clinician.  During your recovery, avoid activity or exercises which involve actions that place your injured hand or elbow above your head or behind your back or head. These positions stress the tissues which are trying to heal. STRENGTH - Scapular Depression and Adduction   With good posture, sit on a firm chair. Supported your arms in front of you with pillows, arm rests or a table top. Have your elbows in line with the sides of your body.  Gently draw your shoulder blades down and toward your mid-back spine. Gradually increase the tension without tensing the muscles along the top of your shoulders and the back of your neck.  Hold for __________ seconds. Slowly release the tension and relax your muscles completely before completing the next repetition.  After you have practiced this exercise, remove the arm support and  complete it in standing as well as sitting. Repeat __________ times. Complete this exercise __________ times per day.  STRENGTH - Scapular Protractors, Standing   Stand arms-length away from a wall. Place your hands on the wall, keeping your elbows straight.  Begin by dropping your shoulder blades down and toward your mid-back spine.  To strengthen your protractors, keep your shoulder blades down, but slide them forward on your rib cage. It will feel as if you are lifting the back of your rib cage away from the wall. This is a subtle motion and can be challenging to complete. Ask your clinician for further instruction if you are not sure you are doing the exercise correctly.  Hold for  __________ seconds. Slowly return to the starting position, resting the muscles completely before completing the next repetition. Repeat __________ times. Complete this exercise __________ times per day. STRENGTH - Scapular Protractors, Supine  Lie on your back on a firm surface. Extend your right / left arm straight into the air while holding a __________ weight in your hand.  Keeping your head and back in place, lift your shoulder off the floor.  Hold __________ seconds. Slowly return to the starting position and allow your muscles to relax completely before completing the next repetition. Repeat __________ times. Complete this exercise __________ times per day. STRENGTH - Scapular Protractors, Quadruped  Get onto your hands and knees with your shoulders directly over your hands (or as close as you comfortably can be).  Keeping your elbows locked, lift the back of your rib cage up into your shoulder blades so your mid-back rounds-out. Keep your neck muscles relaxed.  Hold this position for __________ seconds. Slowly return to the starting position and allow your muscles to relax completely before completing the next repetition. Repeat __________ times. Complete this exercise __________ times per day.   STRENGTH - Scapular Depressors  Keeping your feet on the floor, lift your bottom from the seat and lock your elbows.  Keeping your elbows straight, allow gravity to pull your body weight down. Your shoulders will rise toward your ears.  Raise your body against gravity by drawing your shoulder blades down your back, shortening the distance between your shoulders and ears. Although your feet should always maintain contact with the floor, your feet should progressively support less body weight as you get stronger.  Hold __________ seconds. In a controlled and slow manner, lower your body weight to begin the next repetition. Repeat __________ times. Complete this exercise __________ times per day.  STRENGTH - Shoulder Extensors, Prone  Lie on your stomach on a firm surface so that your right / left arm overhangs the edge. Rest your forehead on your opposite forearm. With your thumb facing away from your body and your elbow straight, hold a __________ weight in your hand.  Squeeze your right / left shoulder blade to your mid-back spine and then slowly raise your arm behind you to the height of the bed.  Hold for __________ seconds. Slowly reverse the directions and return to the starting position, controlling the weight as you lower your arm. Repeat __________ times. Complete this exercise __________ times per day.  STRENGTH - Horizontal Abductors Choose one of the two oppositions to complete this exercise. Prone: lying on stomach:  Lie on your stomach on a firm surface so that your right / left arm overhangs the edge. Rest your forehead on your opposite forearm. With your palm facing the floor and your elbow straight, hold a __________ weight in your hand.  Squeeze your right / left shoulder blade to your mid-back spine and then slowly raise your arm to the height of the bed.  Hold for __________ seconds. Slowly reverse the directions and return to the starting position, controlling the  weight as you lower your arm. Repeat __________ times. Complete this exercise __________ times per day. Standing:  Secure a rubber exercise band/tubing so that it is at the height of your shoulders when you are either standing or sitting on a firm arm-less chair.  Grasp an end of the band/tubing in each hand and have your palms face each other. Straighten your elbows and lift your hands straight in front of you at shoulder height.  Step back away from the secured end of band/tubing until it becomes tense.  Squeeze your shoulder blades together. Keeping your elbows locked and your hands at shoulder-height, bring your hands out to your side.  Hold __________ seconds. Slowly ease the tension on the band/tubing as you reverse the directions and return to the starting position. Repeat __________ times. Complete this exercise __________ times per day. STRENGTH - Scapular Retractors  Secure a rubber exercise band/tubing so that it is at the height of your shoulders when you are either standing or sitting on a firm arm-less chair.  With a palm-down grip, grasp an end of the band/tubing in each hand. Straighten your elbows and lift your hands straight in front of you at shoulder height. Step back away from the secured end of band/tubing until it becomes tense.  Squeezing your shoulder blades together, draw your elbows back as you bend them. Keep your upper arm lifted away from your body throughout the exercise.  Hold __________ seconds. Slowly ease the tension on the band/tubing as you reverse the directions and return to the starting position. Repeat __________ times. Complete this exercise __________ times per day. STRENGTH - Shoulder Extensors   Secure a rubber exercise band/tubing so that it is at the height of your shoulders when you are either standing or sitting on a firm arm-less chair.  With a thumbs-up grip, grasp an end of the band/tubing in each hand. Straighten your elbows and lift your  hands straight in front of you at shoulder height. Step back away from the secured end of band/tubing until it becomes tense.  Squeezing your shoulder blades together, pull your hands down to the sides of your thighs. Do not allow your hands to go behind you.  Hold for __________ seconds. Slowly ease the tension on the band/tubing as you reverse the directions and return to the starting position. Repeat __________ times. Complete this exercise __________ times per day.  STRENGTH - Scapular Retractors and External Rotators  Secure a rubber exercise band/tubing so that it is at the height of your shoulders when you are either standing or sitting on a firm arm-less chair.  With a palm-down grip, grasp an end of the band/tubing in each hand. Bend your elbows 90 degrees and lift your elbows to shoulder height at your sides. Step back away from the secured end of band/tubing until it becomes tense.  Squeezing your shoulder blades together, rotate your shoulder so that your upper arm and elbow remain stationary, but your fists travel upward to head-height.  Hold __________ for seconds. Slowly ease the tension on the band/tubing as you reverse the directions and return to the starting position. Repeat __________ times. Complete this exercise __________ times per day.  STRENGTH - Scapular Retractors and External Rotators, Rowing  Secure a rubber exercise band/tubing so that it is at the height of your shoulders when you are either standing or sitting on a firm arm-less chair.  With a palm-down grip, grasp an end of the band/tubing in each hand. Straighten your elbows and lift your hands straight in front of you at shoulder height. Step back away from the secured end of band/tubing until it becomes tense.  Step 1: Squeeze your shoulder blades together. Bending your elbows, draw your hands to your chest as if you are rowing a boat. At the end of this motion, your hands and elbow should be at  shoulder-height and your elbows should be out to your sides.  Step 2: Rotate  your shoulder to raise your hands above your head. Your forearms should be vertical and your upper-arms should be horizontal.  Hold for __________ seconds. Slowly ease the tension on the band/tubing as you reverse the directions and return to the starting position. Repeat __________ times. Complete this exercise __________ times per day.  STRENGTH - Scapular Retractors and Elevators  Secure a rubber exercise band/tubing so that it is at the height of your shoulders when you are either standing or sitting on a firm arm-less chair.  With a thumbs-up grip, grasp an end of the band/tubing in each hand. Step back away from the secured end of band/tubing until it becomes tense.  Squeezing your shoulder blades together, straighten your elbows and lift your hands straight over your head.  Hold for __________ seconds. Slowly ease the tension on the band/tubing as you reverse the directions and return to the starting position. Repeat __________ times. Complete this exercise __________ times per day.    This information is not intended to replace advice given to you by your health care provider. Make sure you discuss any questions you have with your health care provider.   Document Released: 02/25/2005 Document Revised: 07/12/2014 Document Reviewed: 06/09/2008 [employer] Interactive Patient Education [employer]
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
## 2016-01-12 ENCOUNTER — Ambulatory Visit: Payer: Worker's Compensation

## 2016-05-01 ENCOUNTER — Emergency Department (HOSPITAL_COMMUNITY)
Admission: EM | Admit: 2016-05-01 | Discharge: 2016-05-01 | Disposition: A | Discharge status: Home / Self Care | Payer: 59 | Attending: Emergency Medicine | Admitting: Emergency Medicine

## 2016-05-01 DIAGNOSIS — T2010XA Burn of first degree of head, face, and neck, unspecified site, initial encounter: Secondary | ICD-10-CM

## 2016-05-01 DIAGNOSIS — Y939 Activity, unspecified: Secondary | ICD-10-CM | POA: Insufficient documentation

## 2016-05-01 DIAGNOSIS — T300 Burn of unspecified body region, unspecified degree: Secondary | ICD-10-CM

## 2016-05-01 DIAGNOSIS — F17210 Nicotine dependence, cigarettes, uncomplicated: Secondary | ICD-10-CM | POA: Insufficient documentation

## 2016-05-01 DIAGNOSIS — Y990 Civilian activity done for income or pay: Secondary | ICD-10-CM | POA: Insufficient documentation

## 2016-05-01 DIAGNOSIS — Y929 Unspecified place or not applicable: Secondary | ICD-10-CM | POA: Insufficient documentation

## 2016-05-01 DIAGNOSIS — S0501XA Injury of conjunctiva and corneal abrasion without foreign body, right eye, initial encounter: Secondary | ICD-10-CM

## 2016-05-01 DIAGNOSIS — X16XXXA Contact with hot heating appliances, radiators and pipes, initial encounter: Secondary | ICD-10-CM | POA: Insufficient documentation

## 2016-05-01 DIAGNOSIS — Z79899 Other long term (current) drug therapy: Secondary | ICD-10-CM | POA: Insufficient documentation

## 2016-05-01 MED ORDER — ERYTHROMYCIN 5 MG/GM OP OINT: TOPICAL_OINTMENT | OPHTHALMIC | 0 refills | Status: DC

## 2016-05-01 MED ORDER — IBUPROFEN 800 MG PO TABS
800.0000 mg | ORAL_TABLET | Freq: Once | ORAL | Status: AC
  Administered 2016-05-01: 800 mg via ORAL
  Filled 2016-05-01: qty 1

## 2016-05-01 MED ORDER — TETRACAINE HCL 0.5 % OP SOLN
1.0000 [drp] | Freq: Once | OPHTHALMIC | Status: AC
  Administered 2016-05-01: 1 [drp] via OPHTHALMIC
  Filled 2016-05-01: qty 2

## 2016-05-01 MED ORDER — SILVER SULFADIAZINE 1 % EX CREA: 1.0000 "application " | TOPICAL_CREAM | Freq: Every day | CUTANEOUS | 0 refills | Status: DC

## 2016-05-01 MED ORDER — OXYCODONE HCL 5 MG PO TABS
5.0000 mg | ORAL_TABLET | Freq: Once | ORAL | Status: AC
  Administered 2016-05-01: 5 mg via ORAL
  Filled 2016-05-01: qty 1

## 2016-05-01 MED ORDER — FLUORESCEIN SODIUM 0.6 MG OP STRP
1.0000 | ORAL_STRIP | Freq: Once | OPHTHALMIC | Status: AC
  Administered 2016-05-01: 1 via OPHTHALMIC
  Filled 2016-05-01: qty 1

## 2016-05-01 MED ORDER — SILVER SULFADIAZINE 1 % EX CREA
TOPICAL_CREAM | Freq: Once | CUTANEOUS | Status: AC
  Administered 2016-05-01: 20:00:00 via TOPICAL
  Filled 2016-05-01: qty 85

## 2016-05-01 MED ORDER — ACETAMINOPHEN 500 MG PO TABS
1000.0000 mg | ORAL_TABLET | Freq: Once | ORAL | Status: AC
  Administered 2016-05-01: 1000 mg via ORAL
  Filled 2016-05-01: qty 2
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
## 2016-05-01 NOTE — ED Notes (Signed)
Pt stable, ambulatory, states understanding of discharge instructions 

## 2016-05-01 NOTE — ED Notes (Signed)
Charge RN aware of needing a room for eval
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
## 2016-09-19 IMAGING — CR DG THORACIC SPINE 2V
3 series · 3 of 3 positions shown · non-contrast
Comparison: None

CLINICAL DATA: Thoracic spine back pain, lifting injury

EXAM:
THORACIC SPINE 2 VIEWS

[AP]
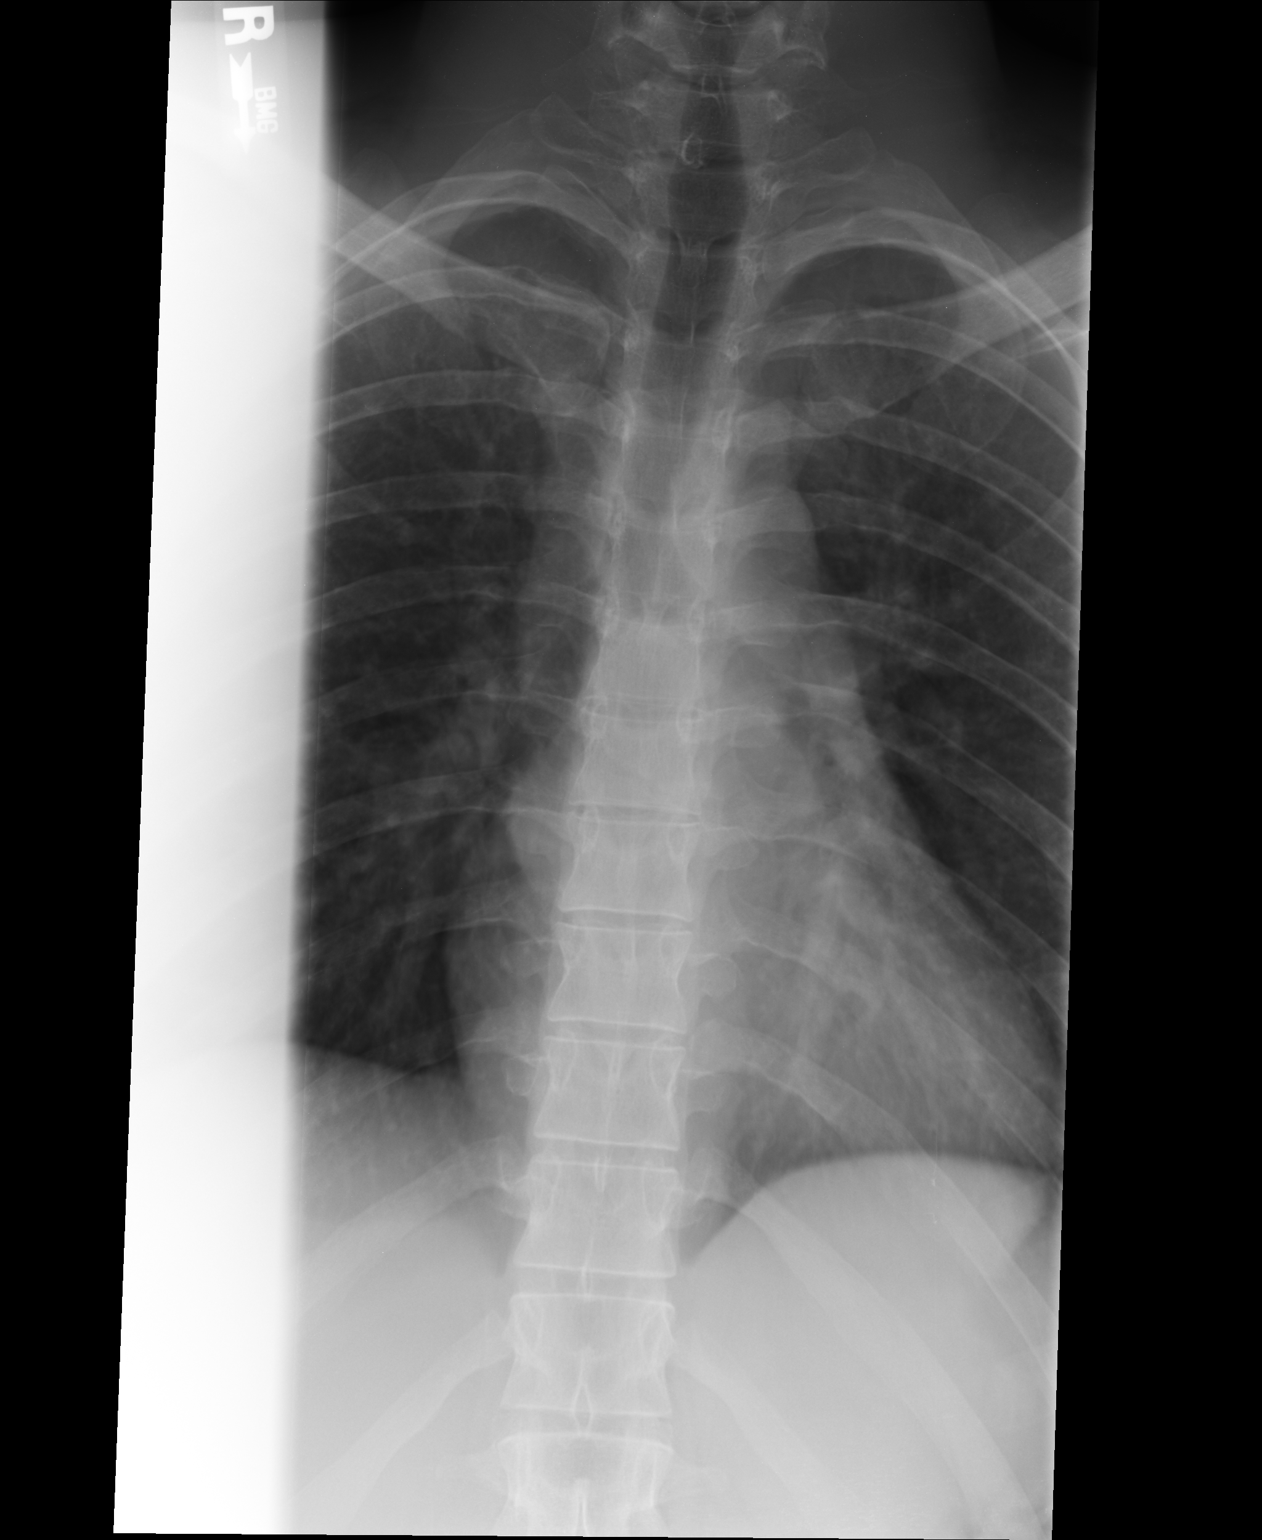

[lateral]
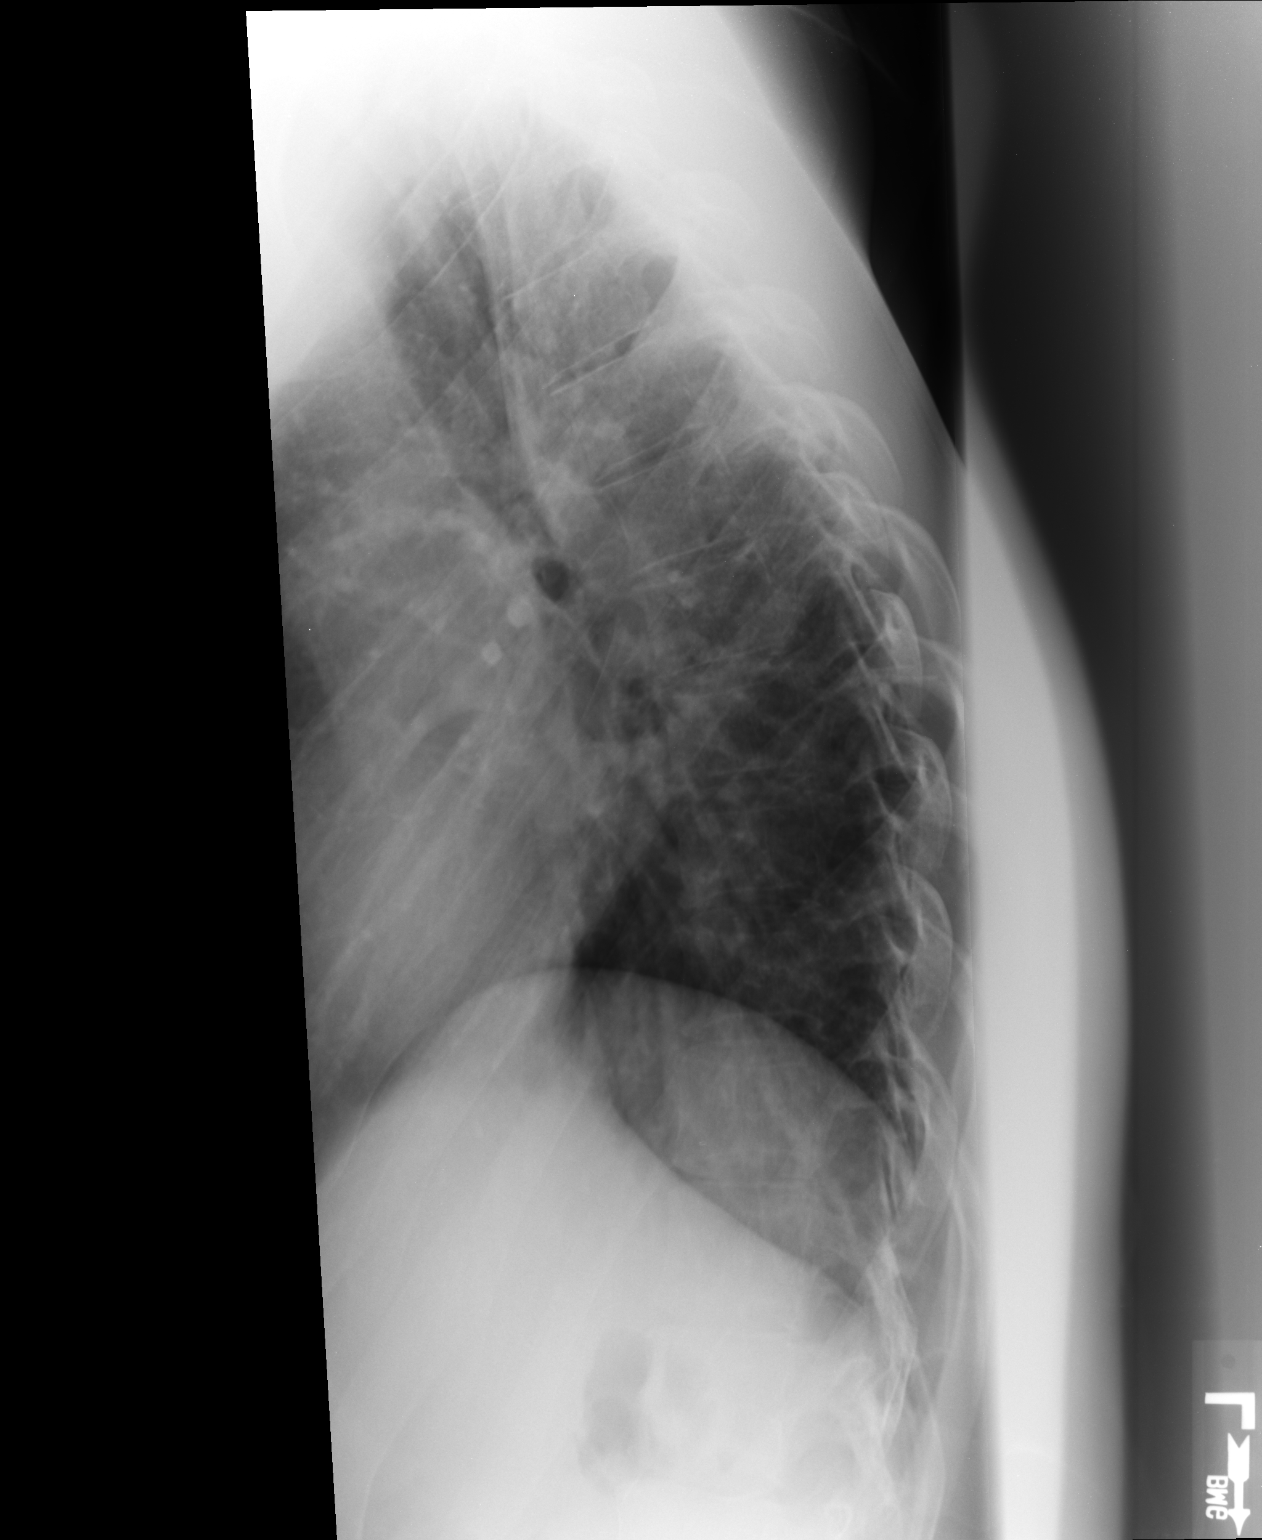

[swimmers]
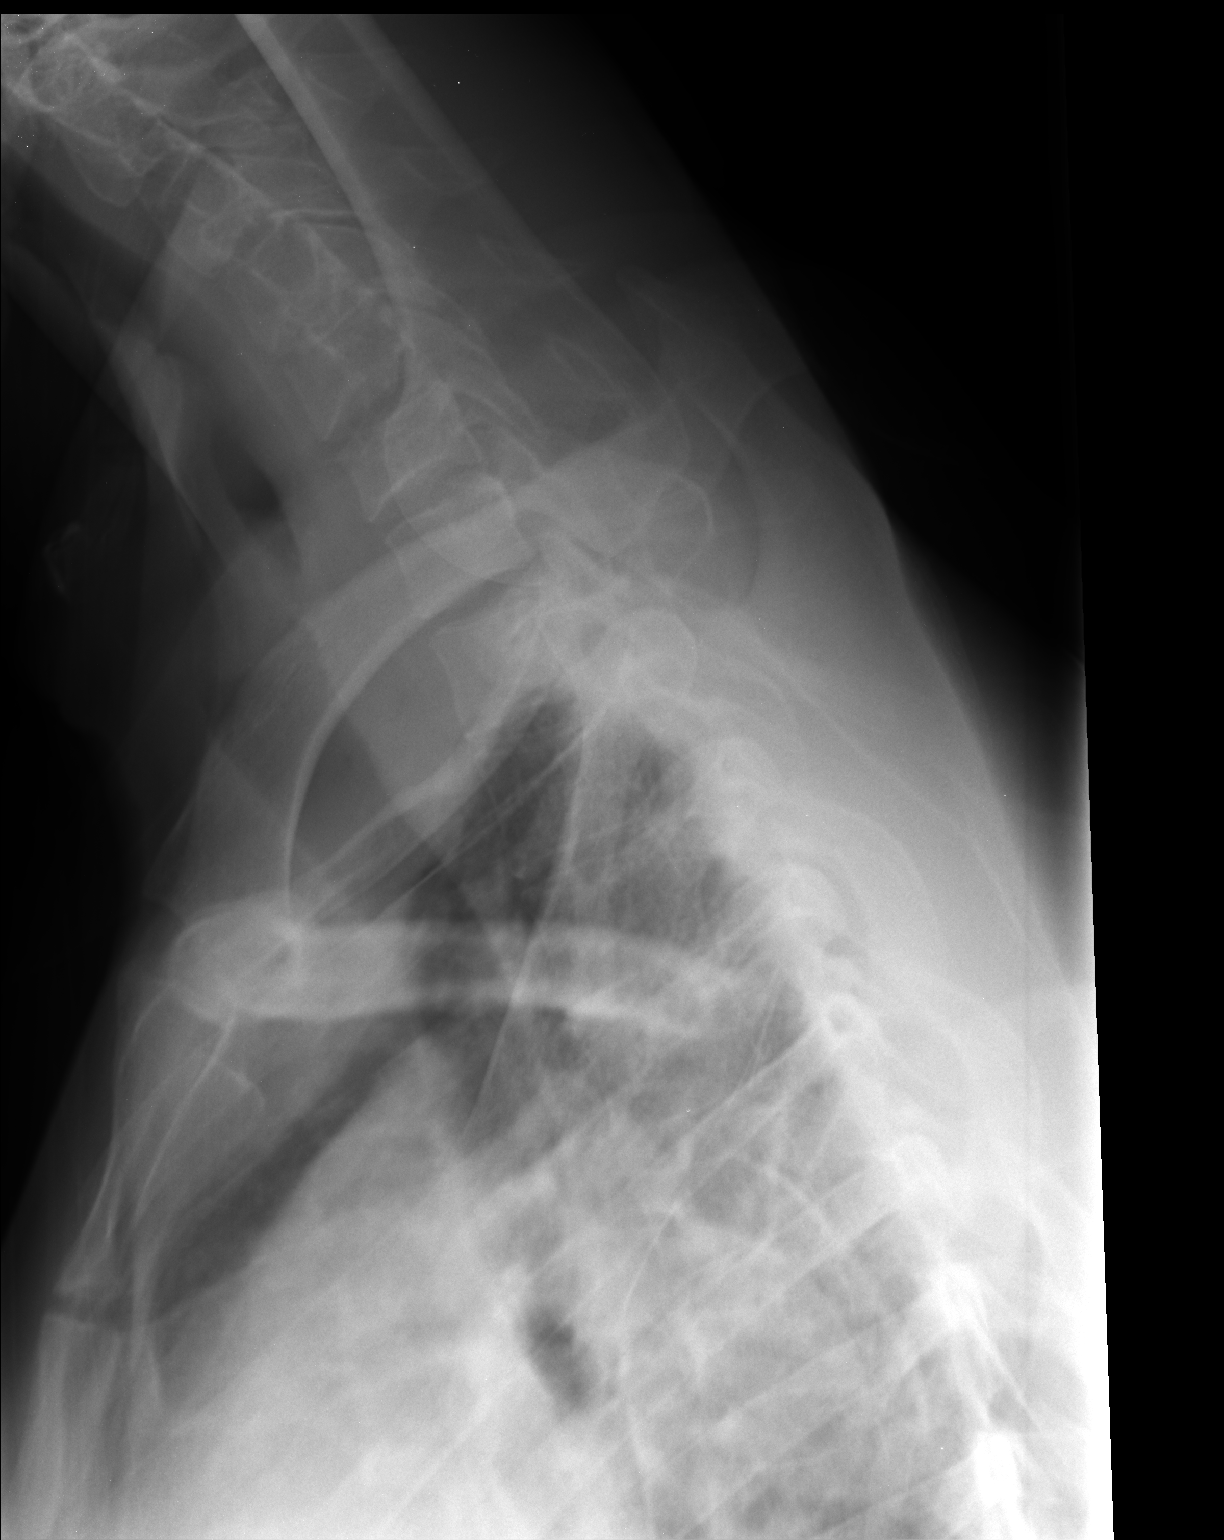

[3 of 3 positions shown; findings below may reference images not displayed]

FINDINGS: 12 pairs of ribs.

Bones appear demineralized.

No acute fracture, subluxation, or bone destruction.

Visualized posterior ribs are unremarkable.
IMPRESSION: No acute osseous abnormalities.

## 2016-10-12 ENCOUNTER — Emergency Department
Admission: EM | Admit: 2016-10-12 | Discharge: 2016-10-12 | Disposition: A | Discharge status: Home / Self Care | Payer: 59 | Attending: Emergency Medicine | Admitting: Emergency Medicine

## 2016-10-12 ENCOUNTER — Emergency Department: Payer: 59

## 2016-10-12 DIAGNOSIS — Y929 Unspecified place or not applicable: Secondary | ICD-10-CM | POA: Insufficient documentation

## 2016-10-12 DIAGNOSIS — S93601A Unspecified sprain of right foot, initial encounter: Secondary | ICD-10-CM | POA: Insufficient documentation

## 2016-10-12 DIAGNOSIS — Z79899 Other long term (current) drug therapy: Secondary | ICD-10-CM | POA: Insufficient documentation

## 2016-10-12 DIAGNOSIS — Y9351 Activity, roller skating (inline) and skateboarding: Secondary | ICD-10-CM | POA: Insufficient documentation

## 2016-10-12 DIAGNOSIS — F17210 Nicotine dependence, cigarettes, uncomplicated: Secondary | ICD-10-CM | POA: Insufficient documentation

## 2016-10-12 DIAGNOSIS — Y999 Unspecified external cause status: Secondary | ICD-10-CM | POA: Insufficient documentation

## 2016-10-12 MED ORDER — IBUPROFEN 800 MG PO TABS: 800.0000 mg | ORAL_TABLET | Freq: Three times a day (TID) | ORAL | 0 refills | Status: DC | PRN

## 2016-10-12 MED ORDER — IBUPROFEN 800 MG PO TABS
800.0000 mg | ORAL_TABLET | Freq: Once | ORAL | Status: AC
  Administered 2016-10-12: 800 mg via ORAL
  Filled 2016-10-12: qty 1

## 2016-10-12 NOTE — Discharge Instructions (Signed)
Take ibuprofen for pain and inflammation for at least 7 days as instructed on the prescription.

## 2016-10-12 NOTE — ED Notes (Signed)

## 2016-10-12 NOTE — ED Provider Notes (Signed)
[facility] Emergency Department Provider Note   ____________________________________________   I have reviewed the triage vital signs and the nursing notes.   HISTORY  Chief Complaint Foot Pain    HPI Marvin Tyler is a 26 y.o. male presents to emergency department with right foot pain after falling while riding a skateboard earlier this evening. Patient reports during the fall he landed on the ball of his foot. He was ambulatory meal following the fall and as time passed increased swelling and pain limited his ability to bear weight and walk. Patient denies any loss of sensation or pulses to the right foot. Swelling and ecchymosis present on the dorsal aspect of the right foot. Patient denies fever, chills, headache, vision changes, chest pain, chest tightness, shortness of breath, abdominal pain, nausea and vomiting.  Past Medical History:  Diagnosis Date  . Allergy     There are no active problems to display for this patient.   Past Surgical History:  Procedure Laterality Date  . TONSILLECTOMY AND ADENOIDECTOMY    . tubes in ears      Prior to Admission medications   Medication Sig Start Date End Date Taking? Authorizing Provider  cyclobenzaprine (FLEXERIL) 10 MG tablet Take 1 tablet (10 mg total) by mouth 3 (three) times daily as needed for muscle spasms. If makes too sleepy just take at bedtime Patient not taking: Reported on 05/01/2016 04/18/15   Tonye Pearsonoolittle, Robert P, MD  erythromycin ophthalmic ointment Place a 1/2 inch ribbon of ointment into the lower eyelid. 05/01/16   Melene PlanFloyd, Dan, DO  HYDROcodone-acetaminophen (NORCO) 5-325 MG tablet Take 1 tablet by mouth every 6 (six) hours as needed for moderate pain. May use 2 at bedtime if can't sleep. Advise not taking during day ay work. Patient not taking: Reported on 05/01/2016 04/18/15   Tonye Pearsonoolittle, Robert P, MD  ibuprofen (ADVIL,MOTRIN) 800 MG tablet Take 1 tablet (800 mg total) by mouth every 8 (eight)  hours as needed for moderate pain. 10/12/16   Barclay Lennox M, PA-C  meloxicam (MOBIC) 15 MG tablet Take 1 tablet (15 mg total) by mouth daily. Patient not taking: Reported on 05/01/2016 04/18/15   Tonye Pearsonoolittle, Robert P, MD  silver sulfADIAZINE (SILVADENE) 1 % cream Apply 1 application topically daily. 05/01/16   Melene PlanFloyd, Dan, DO  triamcinolone (NASACORT) 55 MCG/ACT AERO nasal inhaler Place 2 sprays into the nose daily.    [provider]    Allergies Patient has no known allergies.  Family History  Problem Relation Age of Onset  . Diabetes Other   . Headache Brother     Social History Social History  Substance Use Topics  . Smoking status: Current Every Day Smoker    Packs/day: 0.50    Types: Cigarettes  . Smokeless tobacco: Never Used  . Alcohol use 0.6 - 1.2 oz/week    1 - 2 Standard drinks or equivalent per week     Comment: social    Review of Systems Constitutional: Negative for fever/chills Eyes: No visual changes. ENT:  Negative for sore throat and for difficulty swallowing Cardiovascular: Denies chest pain. Respiratory: Denies cough. Denies shortness of breath. Gastrointestinal: No abdominal pain.  No nausea, vomiting, diarrhea. Genitourinary: Negative for dysuria. Musculoskeletal: Right foot pain secondary to traumatic injury. Skin: Negative for rash. Neurological: Negative for headaches.  Negative focal weakness or numbness. Negative for loss of consciousness. Able to ambulate. ____________________________________________   PHYSICAL EXAM:  VITAL SIGNS: ED Triage Vitals  Enc Vitals Group  BP 10/12/16 2124 101/61     Pulse Rate 10/12/16 2124 72     Resp 10/12/16 2124 18     Temp 10/12/16 2124 99.1 F (37.3 C)     Temp Source 10/12/16 2124 Oral     SpO2 10/12/16 2124 98 %     Weight 10/12/16 2124 150 lb (68 kg)     Height 10/12/16 2124 5\' 9"  (1.753 m)     Head Circumference --      Peak Flow --      Pain Score 10/12/16 2123 9     Pain Loc --       Pain Edu? --      Excl. in GC? --     Constitutional: Alert and oriented. Well appearing and in no acute distress.  Eyes: Conjunctivae are normal. PERRL. EOMI  Head: Normocephalic and atraumatic. ENT:      Ears: Canals clear. TMs intact bilaterally.      Nose: No congestion/rhinnorhea.      Mouth/Throat: Mucous membranes are moist. Neck:Supple. No thyromegaly. No stridor.  Cardiovascular: Normal rate, regular rhythm. Normal S1 and S2.  Good peripheral circulation. Respiratory: Normal respiratory effort without tachypnea or retractions. Lungs CTAB. Good air entry to the bases with no decreased or absent breath sounds. Hematological/Lymphatic/Immunological: No cervical lymphadenopathy. Cardiovascular: Normal rate, regular rhythm. Normal distal pulses. Respiratory: Normal respiratory effort. No wheezes/rales/rhonchi. Lungs CTAB with no W/R/R. Gastrointestinal: Bowel sounds 4 quadrants. Soft and nontender to palpation. No guarding or rigidity. No palpable masses. No distention. No CVA tenderness. Musculoskeletal: Right dorsal foot pain along the distal metatarsal heads with swelling and ecchymosis. Intact movement and sensation to the right foot. No deformities noted. Otherwise, nontender with normal range of motion in all extremities. Neurologic: Normal speech and language. No gross focal neurologic deficits are appreciated. No gait instability. Cranial nerves: II-X intact. No sensory loss or abnormal reflexes.  Skin:  Skin is warm, dry and intact. No rash noted. Psychiatric: Mood and affect are normal. Speech and behavior are normal. Patient exhibits appropriate insight and judgement.  ____________________________________________   LABS (all labs ordered are listed, but only abnormal results are displayed)  Labs Reviewed - No data to display ____________________________________________  EKG None ____________________________________________  RADIOLOGY DG foot complete  right FINDINGS: No acute fracture deformity or dislocation. Bipartite first metatarsal tibial and fibular sesamoids. Corticated bony fragment base of fifth metatarsus. No destructive bony lesions. Soft tissue planes are not suspicious.  IMPRESSION: 1. No acute fracture for dislocation. 2. Old fifth metatarsus avulsion fracture versus unfused apophysis. ____________________________________________   PROCEDURES  Procedure(s) performed: no    Critical Care performed: no ____________________________________________   INITIAL IMPRESSION / ASSESSMENT AND PLAN / ED COURSE  Pertinent labs & imaging results that were available during my care of the patient were reviewed by me and considered in my medical decision making (see chart for details).   Patient presented with right foot pain after fall and injury to the right foot. Patient physical exam findings and imaging are reassuring of no acute fracture or neurovascular injury. Symptoms are consistent with right foot sprain. Right foot swelling management with Ace wrap and patient will be given crutches for mobility and instructed weightbearing as tolerated. Patient will be given prescription for ibuprofen 800 mg for continued pain management. Patient advised to follow up with podiatry as needed and was also advised to return to the emergency department for symptoms that change or worsen. Patient informed of clinical course, understand medical decision-making  process, and agree with plan. ____________________________________________   FINAL CLINICAL IMPRESSION(S) / ED DIAGNOSES  Final diagnoses:  Sprain of right foot, initial encounter       NEW MEDICATIONS STARTED DURING THIS VISIT:  Discharge Medication List as of 10/12/2016 11:07 PM    START taking these medications   Details  ibuprofen (ADVIL,MOTRIN) 800 MG tablet Take 1 tablet (800 mg total) by mouth every 8 (eight) hours as needed for moderate pain., Starting Sat 10/12/2016,  Print         Note:  This document was prepared using Dragon voice recognition software and may include unintentional dictation errors.    Clois Comber, PA-C 10/12/16 2345    Phineas Semen, MD 10/13/16 [phone]

## 2016-10-12 NOTE — ED Triage Notes (Signed)
Reports skateboarding and fell.  Now with right foot pain.

## 2018-02-10 ENCOUNTER — Other Ambulatory Visit: Payer: Self-pay

## 2018-02-10 ENCOUNTER — Encounter: Payer: Self-pay | Admitting: Urology

## 2018-02-10 ENCOUNTER — Ambulatory Visit (INDEPENDENT_AMBULATORY_CARE_PROVIDER_SITE_OTHER): Payer: Self-pay | Admitting: Urology

## 2018-02-10 VITALS — BP 125/83 | HR 70 | Ht 69.0 in | Wt 150.5 lb

## 2018-02-10 DIAGNOSIS — N5082 Scrotal pain: Secondary | ICD-10-CM

## 2018-02-10 DIAGNOSIS — Z3141 Encounter for fertility testing: Secondary | ICD-10-CM

## 2018-02-10 MED ORDER — HYDROCODONE-ACETAMINOPHEN 5-325 MG PO TABS: 1.0000 | ORAL_TABLET | Freq: Four times a day (QID) | ORAL | 0 refills | Status: DC | PRN

## 2018-02-10 NOTE — Progress Notes (Addendum)
02/10/2018 4:22 PM   Marvin Tyler 03/11/1991 [id number]  CC: Left testicular pain  HPI: I saw Marvin Tyler in urology clinic for left testicular pain.  He is an otherwise healthy 27 year old male who works as a Psychologist, occupationalwelder who presented to an outside emergency department on 02/06/2018 with acute onset of severe 10/10 left testicular pain.  There were no instigating factors or trauma.  Scrotal ultrasound at that time showed no torsion with normal flow bilaterally, and a small left varicocele and small left hydrocele.  Urinalysis and STD testing were negative.  He reports he has continued to have left-sided pain over the last few days that is minimally improved.  There are no aggravating or alleviating factors.  He denies any gross hematuria or voiding symptoms.  He is also interested in fertility work-up, as he and his wife are planning to try for a pregnancy soon.  He denies any prior abdominal surgeries or hernia repairs.   PMH: Past Medical History:  Diagnosis Date  . Allergy     Surgical History: Past Surgical History:  Procedure Laterality Date  . TONSILLECTOMY AND ADENOIDECTOMY    . tubes in ears      Allergies: No Known Allergies  Family History: Family History  Problem Relation Age of Onset  . Headache Brother   . Diabetes Other     Social History:  reports that he has been smoking cigarettes. He has been smoking about 0.50 packs per day. He has never used smokeless tobacco. He reports that he drinks about 1.0 - 2.0 standard drinks of alcohol per week. He reports that he does not use drugs.  ROS: Please see flowsheet from today's date for complete review of systems.  Physical Exam: BP 125/83   Pulse 70   Ht 5\' 9"  (1.753 m)   Wt 150 lb 8 oz (68.3 kg)   BMI 22.22 kg/m    Constitutional:  Alert and oriented, No acute distress. Cardiovascular: No clubbing, cyanosis, or edema. Respiratory: Normal respiratory effort, no increased work of breathing. GI: Abdomen  is soft, nontender, nondistended, no abdominal masses GU: No CVA tenderness, circumcised phallus without lesions, widely patent meatus.  Testicles descended and 20 cc bilaterally, no masses.  Small left varicocele on exam.  The left testicle, epididymis, and spermatic cord are diffusely tender to palpation Lymph: No cervical or inguinal lymphadenopathy. Skin: No rashes, bruises or suspicious lesions. Neurologic: Grossly intact, no focal deficits, moving all 4 extremities. Psychiatric: Normal mood and affect.  Laboratory Data: Reviewed Urinalysis 02/06/2018 0 WBCs, 0 RBCs, no bacteria Gonorrhea and Chlamydia testing negative  Assessment & Plan:   In summary, Marvin Tyler is a 27 year old male with acute onset of left testicular pain over the last week.  His scrotal ultrasound at outside facility was essentially normal, with a small left varicocele and small left hydrocele.  There are no signs of infection with a negative urinalysis and negative STD testing.  His scrotal exam is notable for diffuse tenderness on the left side, but no palpable abnormalities.  We discussed scrotal pain in detail today, and the unclear etiology.  His scrotal ultrasound and exam are reassuring.  I recommended snug fitting underwear with compression shorts or jockstrap, icing as needed, and avoiding strenuous activity that worsen his scrotal pain.  I discussed with him extensively that I do not feel his pain is secondary to his varicocele or hydrocele.  I also recommended a 4-week course of scheduled anti-inflammatories.  I also provided him  with a prescription for 10 pills of Norco for his severe pain at this time.  Follow-up in 4 weeks to see if his symptoms have improved.  If ongoing severe pain at that time, consider gabapentin or amitriptyline.  He also would like to drop off a semen sample to evaluate for fertility, which I have ordered.  Sondra Come, MD  [facility] [address] [place], [place] [address] [phone]

## 2018-03-12 ENCOUNTER — Ambulatory Visit: Payer: Self-pay | Admitting: Urology

## 2018-03-12 ENCOUNTER — Encounter: Payer: Self-pay | Admitting: Urology

## 2018-03-16 IMAGING — CR DG FOOT COMPLETE 3+V*R*
3 series · 3 of 3 positions shown · non-contrast
Comparison: None.

CLINICAL DATA: Fell skateboarding.  Foot pain.

EXAM:
RIGHT FOOT COMPLETE - 3+ VIEW

[foot ap]
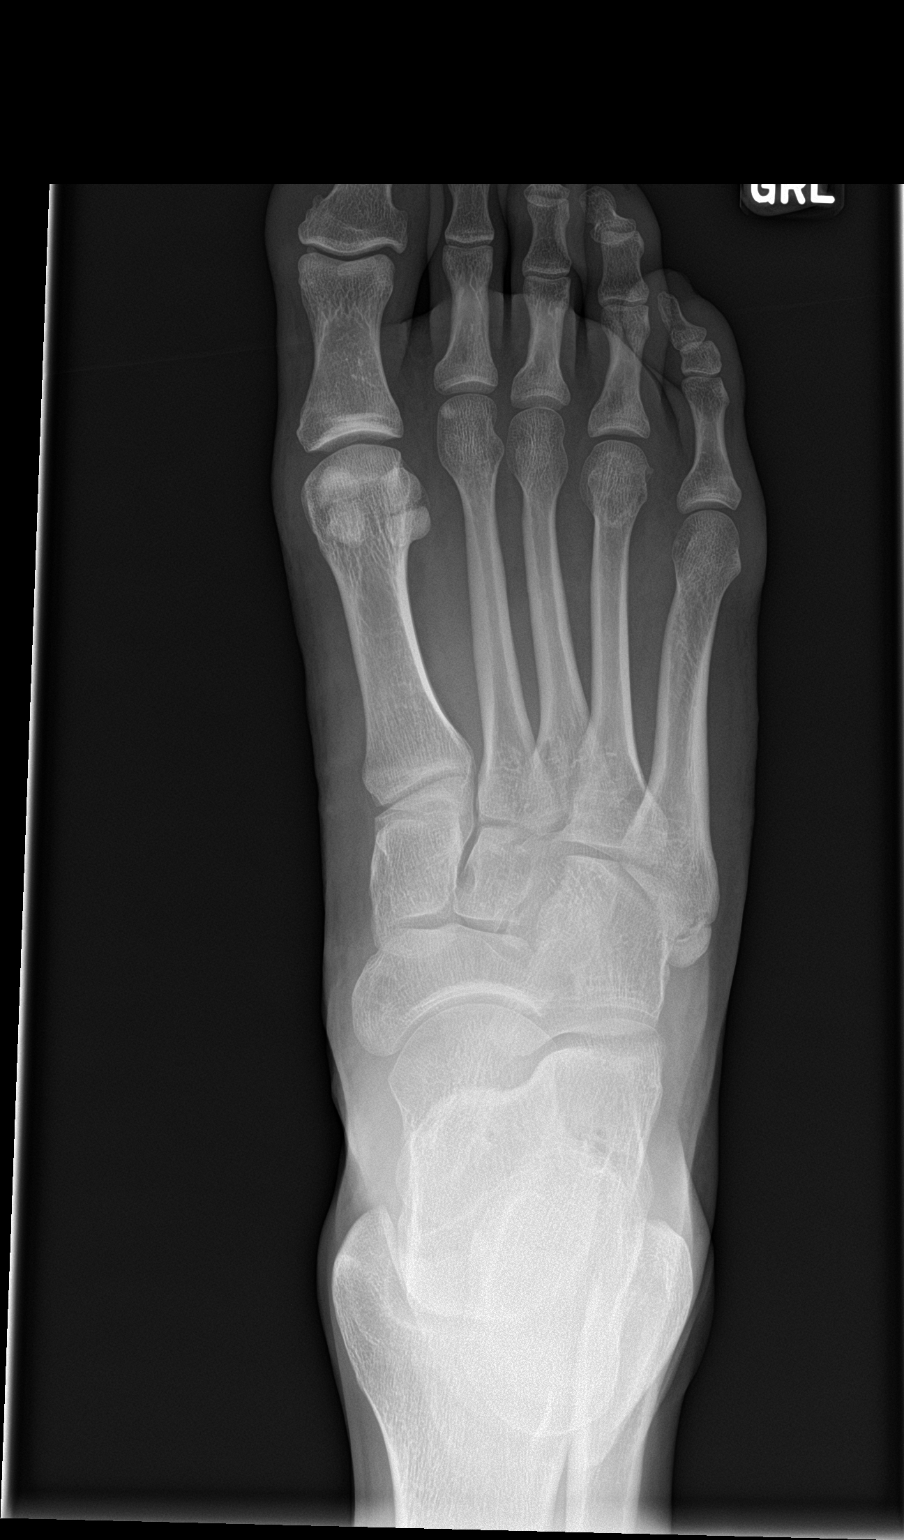

[foot obl]
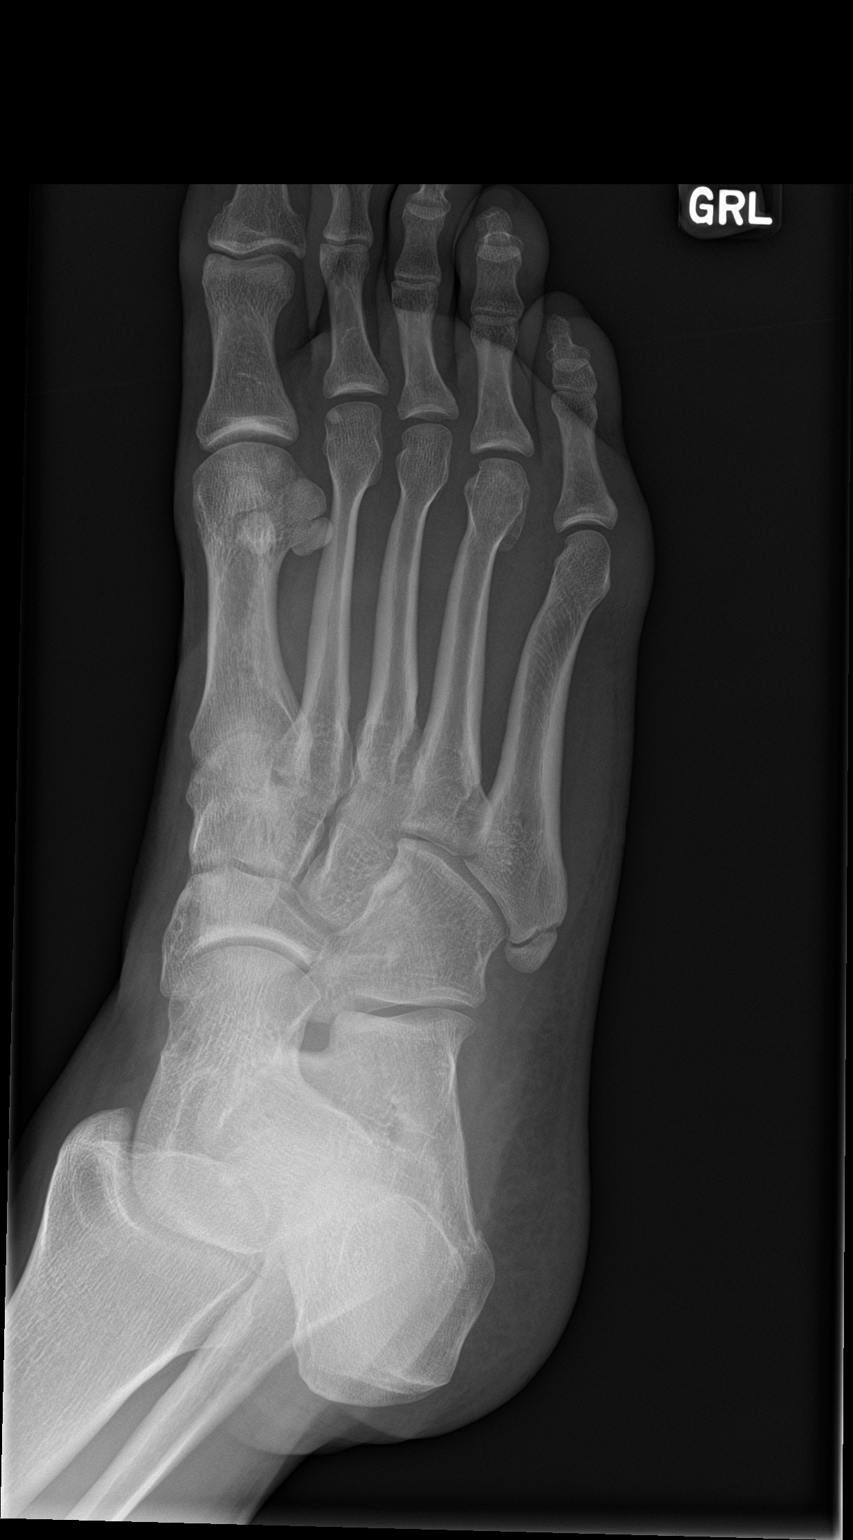

[foot lat]
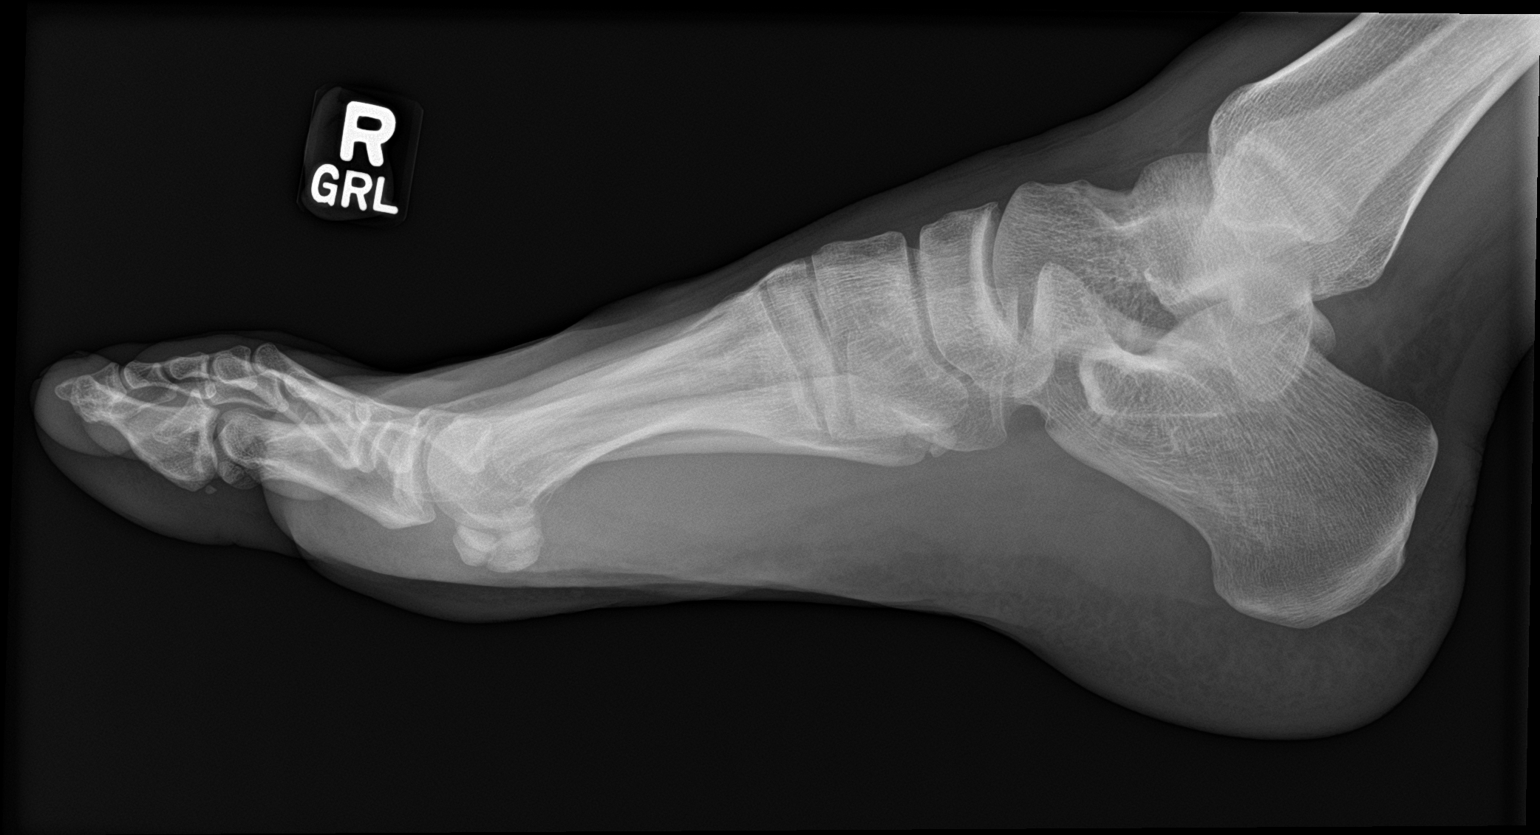

[3 of 3 positions shown; findings below may reference images not displayed]

FINDINGS: No acute fracture deformity or dislocation. Bipartite first
metatarsal tibial and fibular sesamoids. Corticated bony fragment
base of fifth metatarsus. No destructive bony lesions. Soft tissue
planes are not suspicious.
IMPRESSION: 1. No acute fracture for dislocation.
2. Old fifth metatarsus avulsion fracture versus unfused apophysis.

## 2023-10-30 ENCOUNTER — Encounter (HOSPITAL_BASED_OUTPATIENT_CLINIC_OR_DEPARTMENT_OTHER): Payer: Self-pay

## 2023-10-30 ENCOUNTER — Other Ambulatory Visit (HOSPITAL_BASED_OUTPATIENT_CLINIC_OR_DEPARTMENT_OTHER): Payer: Self-pay

## 2023-10-30 ENCOUNTER — Ambulatory Visit (HOSPITAL_BASED_OUTPATIENT_CLINIC_OR_DEPARTMENT_OTHER)
Admission: EM | Admit: 2023-10-30 | Discharge: 2023-10-30 | Disposition: A | Discharge status: Home / Self Care | Attending: Family Medicine | Admitting: Family Medicine

## 2023-10-30 DIAGNOSIS — J0100 Acute maxillary sinusitis, unspecified: Secondary | ICD-10-CM | POA: Diagnosis not present

## 2023-10-30 DIAGNOSIS — H6690 Otitis media, unspecified, unspecified ear: Secondary | ICD-10-CM

## 2023-10-30 MED ORDER — TRIAMCINOLONE ACETONIDE 40 MG/ML IJ SUSP
40.0000 mg | Freq: Once | INTRAMUSCULAR | Status: AC
  Administered 2023-10-30: 40 mg via INTRAMUSCULAR

## 2023-10-30 MED ORDER — AMOXICILLIN-POT CLAVULANATE 875-125 MG PO TABS
1.0000 | ORAL_TABLET | Freq: Two times a day (BID) | ORAL | 0 refills | Status: AC
  Filled 2023-10-30: qty 14, 7d supply, fill #0

## 2023-10-30 NOTE — ED Triage Notes (Signed)
 Patient complains of ongoing loss of voice, upper throat pain, congestion, sinus pressure for a month. Last night patient began experiencing ear pain. Patient has tried home remedies and mucinex with little relief. Back of throat appears red.

## 2023-10-30 NOTE — ED Provider Notes (Signed)
 [facility]    CSN: [id number] Arrival date & time: 10/30/23  1056      History   Chief Complaint Chief Complaint  Patient presents with   Nasal Congestion   Otalgia    HPI Marvin Tyler is a 33 y.o. male.   Patient is a 33 year old male who presents today with ongoing loss of voice, upper throat pain, congestion, sinus pressure, sinus drainage for a month. Last night patient began experiencing right ear pain.  Overall just feeling very fatigued.  Patient has tried home remedies and mucinex with little relief.    Otalgia   Past Medical History:  Diagnosis Date   Allergy     There are no active problems to display for this patient.   Past Surgical History:  Procedure Laterality Date   TONSILLECTOMY AND ADENOIDECTOMY     tubes in ears         Home Medications    Prior to Admission medications   Medication Sig Start Date End Date Taking? Authorizing Provider  amoxicillin -clavulanate (AUGMENTIN ) 875-125 MG tablet Take 1 tablet by mouth every 12 (twelve) hours. 10/30/23  Yes Adah Wilbert LABOR, FNP    Family History Family History  Problem Relation Age of Onset   Headache Brother    Diabetes Other     Social History Social History   Tobacco Use   Smoking status: Former    Current packs/day: 0.50    Types: Cigarettes   Smokeless tobacco: Never  Vaping Use   Vaping status: Every Day  Substance Use Topics   Alcohol use: Yes    Alcohol/week: 1.0 - 2.0 standard drink of alcohol    Types: 1 - 2 Standard drinks or equivalent per week    Comment: social   Drug use: No     Allergies   Patient has no known allergies.   Review of Systems Review of Systems  HENT:  Positive for ear pain.   See HPI   Physical Exam Triage Vital Signs ED Triage Vitals  Encounter Vitals Group     BP 10/30/23 1108 122/75     Girls Systolic BP Percentile --      Girls Diastolic BP Percentile --      Boys Systolic BP Percentile --      Boys Diastolic BP  Percentile --      Pulse Rate 10/30/23 1108 65     Resp 10/30/23 1108 20     Temp 10/30/23 1108 98.5 F (36.9 C)     Temp Source 10/30/23 1108 Oral     SpO2 10/30/23 1108 97 %     Weight --      Height --      Head Circumference --      Peak Flow --      Pain Score 10/30/23 1109 3     Pain Loc --      Pain Education --      Exclude from Growth Chart --    No data found.  Updated Vital Signs BP 122/75 (BP Location: Right Arm)   Pulse 65   Temp 98.5 F (36.9 C) (Oral)   Resp 20   SpO2 97%   Visual Acuity Right Eye Distance:   Left Eye Distance:   Bilateral Distance:    Right Eye Near:   Left Eye Near:    Bilateral Near:     Physical Exam Constitutional:      General: He is not in acute distress.  Appearance: Normal appearance. He is not ill-appearing, toxic-appearing or diaphoretic.  HENT:     Right Ear: Ear canal and external ear normal. Tympanic membrane is erythematous and retracted.     Left Ear: Tympanic membrane, ear canal and external ear normal.     Nose: Congestion present.     Mouth/Throat:     Pharynx: Oropharynx is clear.  Eyes:     Conjunctiva/sclera: Conjunctivae normal.  Cardiovascular:     Rate and Rhythm: Normal rate and regular rhythm.     Pulses: Normal pulses.     Heart sounds: Normal heart sounds.  Pulmonary:     Effort: Pulmonary effort is normal.     Breath sounds: Normal breath sounds.  Musculoskeletal:        General: Normal range of motion.  Skin:    General: Skin is warm and dry.  Neurological:     Mental Status: He is alert.  Psychiatric:        Mood and Affect: Mood normal.      UC Treatments / Results  Labs (all labs ordered are listed, but only abnormal results are displayed) Labs Reviewed - No data to display  EKG   Radiology No results found.  Procedures Procedures (including critical care time)  Medications Ordered in UC Medications  triamcinolone  acetonide (KENALOG -40) injection 40 mg (40 mg  Intramuscular Given 10/30/23 1152)    Initial Impression / Assessment and Plan / UC Course  I have reviewed the triage vital signs and the nursing notes.  Pertinent labs & imaging results that were available during my care of the patient were reviewed by me and considered in my medical decision making (see chart for details).     Acute sinusitis and otitis media-treating with Augmentin .  Medication as prescribed.  Steroid injection given here today in clinic.  Recommend over-the-counter medications as needed.  Follow-up as needed Final Clinical Impressions(s) / UC Diagnoses   Final diagnoses:  Acute non-recurrent maxillary sinusitis  Acute otitis media, unspecified otitis media type     Discharge Instructions      Treating you for sinus infection and ear infection.  Take the medication as prescribed. We also gave you a steroid injection here today. You can continue over-the-counter medications as needed.  Follow-up as needed   ED Prescriptions     Medication Sig Dispense Auth. Provider   amoxicillin -clavulanate (AUGMENTIN ) 875-125 MG tablet Take 1 tablet by mouth every 12 (twelve) hours. 14 tablet Adah Wilbert LABOR, FNP      PDMP not reviewed this encounter.   Adah Wilbert LABOR, FNP 10/30/23 1220

## 2023-10-30 NOTE — Discharge Instructions (Addendum)
 Treating you for sinus infection and ear infection.  Take the medication as prescribed. We also gave you a steroid injection here today. You can continue over-the-counter medications as needed.  Follow-up as needed
